# Patient Record
Sex: Female | Born: 1965 | Race: White | Hispanic: No | Marital: Married | Smoking: Never smoker
Health system: Southern US, Community
[De-identification: ages and names within clinical notes are randomized; demographics above are authoritative.]

## PROBLEM LIST (undated history)

## (undated) DIAGNOSIS — E119 Type 2 diabetes mellitus without complications: Secondary | ICD-10-CM

## (undated) HISTORY — PX: COSMETIC SURGERY: SHX468

---

## 1997-06-01 ENCOUNTER — Other Ambulatory Visit: Admission: RE | Admit: 1997-06-01 | Discharge: 1997-06-01 | Payer: Self-pay | Admitting: Gynecology

## 1997-06-28 ENCOUNTER — Other Ambulatory Visit: Admission: RE | Admit: 1997-06-28 | Discharge: 1997-06-28 | Payer: Self-pay | Admitting: Gynecology

## 1997-09-15 ENCOUNTER — Ambulatory Visit (HOSPITAL_BASED_OUTPATIENT_CLINIC_OR_DEPARTMENT_OTHER): Admission: RE | Admit: 1997-09-15 | Discharge: 1997-09-15 | Payer: Self-pay | Admitting: *Deleted

## 1997-12-13 ENCOUNTER — Other Ambulatory Visit: Admission: RE | Admit: 1997-12-13 | Discharge: 1997-12-13 | Payer: Self-pay | Admitting: Gynecology

## 1998-02-08 ENCOUNTER — Other Ambulatory Visit: Admission: RE | Admit: 1998-02-08 | Discharge: 1998-02-08 | Payer: Self-pay | Admitting: Gynecology

## 1998-06-27 ENCOUNTER — Other Ambulatory Visit: Admission: RE | Admit: 1998-06-27 | Discharge: 1998-06-27 | Payer: Self-pay | Admitting: Gynecology

## 1998-07-25 ENCOUNTER — Other Ambulatory Visit: Admission: RE | Admit: 1998-07-25 | Discharge: 1998-07-25 | Payer: Self-pay | Admitting: Gynecology

## 1999-01-12 ENCOUNTER — Other Ambulatory Visit: Admission: RE | Admit: 1999-01-12 | Discharge: 1999-01-12 | Payer: Self-pay | Admitting: Gynecology

## 2000-01-18 ENCOUNTER — Other Ambulatory Visit: Admission: RE | Admit: 2000-01-18 | Discharge: 2000-01-18 | Payer: Self-pay | Admitting: Gynecology

## 2000-05-15 ENCOUNTER — Other Ambulatory Visit: Admission: RE | Admit: 2000-05-15 | Discharge: 2000-05-15 | Payer: Self-pay | Admitting: Obstetrics and Gynecology

## 2000-09-03 ENCOUNTER — Inpatient Hospital Stay (HOSPITAL_COMMUNITY): Admission: AD | Admit: 2000-09-03 | Discharge: 2000-09-03 | Payer: Self-pay | Admitting: Obstetrics and Gynecology

## 2000-11-10 ENCOUNTER — Encounter: Payer: Self-pay | Admitting: Obstetrics and Gynecology

## 2000-11-10 ENCOUNTER — Inpatient Hospital Stay (HOSPITAL_COMMUNITY): Admission: AD | Admit: 2000-11-10 | Discharge: 2000-11-13 | Payer: Self-pay | Admitting: Obstetrics and Gynecology

## 2000-11-14 ENCOUNTER — Encounter: Admission: RE | Admit: 2000-11-14 | Discharge: 2000-12-14 | Payer: Self-pay | Admitting: Obstetrics and Gynecology

## 2001-07-09 ENCOUNTER — Other Ambulatory Visit: Admission: RE | Admit: 2001-07-09 | Discharge: 2001-07-09 | Payer: Self-pay | Admitting: Obstetrics and Gynecology

## 2001-09-03 ENCOUNTER — Ambulatory Visit (HOSPITAL_COMMUNITY): Admission: RE | Admit: 2001-09-03 | Discharge: 2001-09-03 | Payer: Self-pay | Admitting: Obstetrics and Gynecology

## 2001-09-03 ENCOUNTER — Encounter: Payer: Self-pay | Admitting: Obstetrics and Gynecology

## 2001-09-05 ENCOUNTER — Encounter: Admission: RE | Admit: 2001-09-05 | Discharge: 2001-12-04 | Payer: Self-pay | Admitting: Obstetrics and Gynecology

## 2001-11-06 ENCOUNTER — Inpatient Hospital Stay (HOSPITAL_COMMUNITY): Admission: AD | Admit: 2001-11-06 | Discharge: 2001-11-06 | Payer: Self-pay | Admitting: Obstetrics and Gynecology

## 2002-01-02 ENCOUNTER — Encounter (HOSPITAL_COMMUNITY): Admission: RE | Admit: 2002-01-02 | Discharge: 2002-01-02 | Payer: Self-pay | Admitting: Obstetrics and Gynecology

## 2002-01-06 ENCOUNTER — Ambulatory Visit (HOSPITAL_COMMUNITY): Admission: RE | Admit: 2002-01-06 | Discharge: 2002-01-06 | Payer: Self-pay | Admitting: Obstetrics and Gynecology

## 2002-01-06 ENCOUNTER — Encounter: Payer: Self-pay | Admitting: Obstetrics and Gynecology

## 2002-01-09 ENCOUNTER — Inpatient Hospital Stay (HOSPITAL_COMMUNITY): Admission: AD | Admit: 2002-01-09 | Discharge: 2002-01-12 | Payer: Self-pay | Admitting: Obstetrics and Gynecology

## 2003-06-08 ENCOUNTER — Other Ambulatory Visit: Admission: RE | Admit: 2003-06-08 | Discharge: 2003-06-08 | Payer: Self-pay | Admitting: Obstetrics and Gynecology

## 2011-03-04 ENCOUNTER — Ambulatory Visit: Payer: Self-pay | Admitting: Family Medicine

## 2011-03-04 VITALS — BP 123/80 | HR 72 | Temp 98.4°F | Resp 16 | Ht 67.0 in | Wt 253.6 lb

## 2011-03-04 DIAGNOSIS — J029 Acute pharyngitis, unspecified: Secondary | ICD-10-CM

## 2011-03-04 LAB — POCT RAPID STREP A (OFFICE): Rapid Strep A Screen: NEGATIVE

## 2011-03-04 MED ORDER — MAGIC MOUTHWASH W/LIDOCAINE: 5.0000 mL | Freq: Four times a day (QID) | ORAL | Status: DC | PRN

## 2011-03-04 MED ORDER — AMOXICILLIN 875 MG PO TABS: 875.0000 mg | ORAL_TABLET | Freq: Two times a day (BID) | ORAL | Status: AC

## 2011-03-04 NOTE — Patient Instructions (Signed)

## 2011-03-04 NOTE — Progress Notes (Signed)
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  O:  Throat is very red with white exudates on both sides, worse on the right. TM's normal Neck is supple without thyromegaly or adenopathy Chest is clear NAD Results for orders placed in visit on 03/04/11  POCT RAPID STREP A (OFFICE)      Component Value Range   Rapid Strep A Screen Negative  Negative     A:  nonstrep pharyngitis P:  amox x 7 days, magic mouthwash

## 2013-12-15 ENCOUNTER — Ambulatory Visit (INDEPENDENT_AMBULATORY_CARE_PROVIDER_SITE_OTHER): Payer: No Typology Code available for payment source | Admitting: Family Medicine

## 2013-12-15 VITALS — BP 128/76 | HR 89 | Temp 98.4°F | Resp 16 | Ht 67.0 in | Wt 248.0 lb

## 2013-12-15 DIAGNOSIS — S46812A Strain of other muscles, fascia and tendons at shoulder and upper arm level, left arm, initial encounter: Secondary | ICD-10-CM

## 2013-12-15 DIAGNOSIS — M79602 Pain in left arm: Secondary | ICD-10-CM

## 2013-12-15 MED ORDER — TRAMADOL HCL 50 MG PO TABS: 50.0000 mg | ORAL_TABLET | Freq: Three times a day (TID) | ORAL | Status: DC | PRN

## 2013-12-15 MED ORDER — METAXALONE 800 MG PO TABS: 800.0000 mg | ORAL_TABLET | Freq: Three times a day (TID) | ORAL | Status: DC
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
## 2013-12-15 NOTE — Patient Instructions (Addendum)
Ice upper shoulder area for 15 or 20 minutes 3 or 4 times daily  Gentle loosening exercises  Avoid hard lifting or straining and overhead work for the next few days.  If not improving over the next few days please return  Take over-the-counter ibuprofen 800 mg 3 times daily (200 mg 4)  Take the tramadol if needed for more severe pain  Take the muscle relaxant one pill 3 times daily, Skelaxin ( metaxalone)

## 2019-01-14 ENCOUNTER — Other Ambulatory Visit (HOSPITAL_COMMUNITY)
Admission: RE | Admit: 2019-01-14 | Discharge: 2019-01-14 | Disposition: A | Discharge status: Home / Self Care | Payer: 59 | Source: Ambulatory Visit | Attending: Physician Assistant | Admitting: Physician Assistant

## 2019-01-14 ENCOUNTER — Other Ambulatory Visit: Payer: Self-pay | Admitting: Physician Assistant

## 2019-01-14 DIAGNOSIS — Z124 Encounter for screening for malignant neoplasm of cervix: Secondary | ICD-10-CM | POA: Insufficient documentation

## 2019-01-15 LAB — CYTOLOGY - PAP: Diagnosis: NEGATIVE

## 2019-02-16 ENCOUNTER — Other Ambulatory Visit: Payer: Self-pay | Admitting: Nurse Practitioner

## 2019-02-16 DIAGNOSIS — Z1231 Encounter for screening mammogram for malignant neoplasm of breast: Secondary | ICD-10-CM

## 2019-03-26 ENCOUNTER — Ambulatory Visit
Admission: RE | Admit: 2019-03-26 | Discharge: 2019-03-26 | Disposition: A | Discharge status: Home / Self Care | Payer: 59 | Source: Ambulatory Visit | Attending: Nurse Practitioner | Admitting: Nurse Practitioner

## 2019-03-26 ENCOUNTER — Other Ambulatory Visit: Payer: Self-pay

## 2019-03-26 DIAGNOSIS — Z1231 Encounter for screening mammogram for malignant neoplasm of breast: Secondary | ICD-10-CM

## 2019-03-30 ENCOUNTER — Other Ambulatory Visit: Payer: Self-pay | Admitting: Nurse Practitioner

## 2019-03-30 DIAGNOSIS — R928 Other abnormal and inconclusive findings on diagnostic imaging of breast: Secondary | ICD-10-CM

## 2019-04-02 ENCOUNTER — Other Ambulatory Visit: Payer: Self-pay | Admitting: Nurse Practitioner

## 2019-04-02 ENCOUNTER — Other Ambulatory Visit: Payer: Self-pay

## 2019-04-02 ENCOUNTER — Ambulatory Visit
Admission: RE | Admit: 2019-04-02 | Discharge: 2019-04-02 | Disposition: A | Discharge status: Home / Self Care | Payer: 59 | Source: Ambulatory Visit | Attending: Nurse Practitioner | Admitting: Nurse Practitioner

## 2019-04-02 DIAGNOSIS — R928 Other abnormal and inconclusive findings on diagnostic imaging of breast: Secondary | ICD-10-CM

## 2019-04-02 DIAGNOSIS — N6489 Other specified disorders of breast: Secondary | ICD-10-CM

## 2019-04-20 ENCOUNTER — Encounter (HOSPITAL_COMMUNITY): Payer: Self-pay

## 2019-04-20 ENCOUNTER — Other Ambulatory Visit: Payer: Self-pay

## 2019-04-20 ENCOUNTER — Emergency Department (HOSPITAL_COMMUNITY): Payer: 59

## 2019-04-20 ENCOUNTER — Emergency Department (HOSPITAL_COMMUNITY)
Admission: EM | Admit: 2019-04-20 | Discharge: 2019-04-20 | Disposition: A | Discharge status: Home / Self Care | Payer: 59 | Attending: Emergency Medicine | Admitting: Emergency Medicine

## 2019-04-20 DIAGNOSIS — Y998 Other external cause status: Secondary | ICD-10-CM | POA: Insufficient documentation

## 2019-04-20 DIAGNOSIS — W010XXA Fall on same level from slipping, tripping and stumbling without subsequent striking against object, initial encounter: Secondary | ICD-10-CM | POA: Insufficient documentation

## 2019-04-20 DIAGNOSIS — E119 Type 2 diabetes mellitus without complications: Secondary | ICD-10-CM | POA: Insufficient documentation

## 2019-04-20 DIAGNOSIS — S43401A Unspecified sprain of right shoulder joint, initial encounter: Secondary | ICD-10-CM | POA: Diagnosis not present

## 2019-04-20 DIAGNOSIS — Y929 Unspecified place or not applicable: Secondary | ICD-10-CM | POA: Diagnosis not present

## 2019-04-20 DIAGNOSIS — Y9389 Activity, other specified: Secondary | ICD-10-CM | POA: Insufficient documentation

## 2019-04-20 DIAGNOSIS — S4991XA Unspecified injury of right shoulder and upper arm, initial encounter: Secondary | ICD-10-CM | POA: Diagnosis present

## 2019-04-20 HISTORY — DX: Type 2 diabetes mellitus without complications: E11.9

## 2019-04-20 MED ORDER — ONDANSETRON HCL 4 MG/2ML IJ SOLN
4.0000 mg | Freq: Once | INTRAMUSCULAR | Status: AC
  Administered 2019-04-20: 06:00:00 4 mg via INTRAVENOUS
  Filled 2019-04-20: qty 2

## 2019-04-20 MED ORDER — MORPHINE SULFATE (PF) 4 MG/ML IV SOLN
4.0000 mg | Freq: Once | INTRAVENOUS | Status: AC
  Administered 2019-04-20: 4 mg via INTRAVENOUS
  Filled 2019-04-20: qty 1

## 2019-04-20 MED ORDER — HYDROCODONE-ACETAMINOPHEN 5-325 MG PO TABS: 1.0000 | ORAL_TABLET | ORAL | 0 refills | Status: DC | PRN

## 2019-04-20 NOTE — ED Notes (Signed)
ED Provider at bedside. 

## 2019-04-20 NOTE — ED Triage Notes (Signed)
Patient arrived today after a mechanical fall landing on her right arm. Patient reporting pain from her shoulder down to her wrist. Currently wrapped in a sling. Pain primarily in shoulder/ upper arm. No obvious deformity.

## 2019-04-20 NOTE — ED Provider Notes (Signed)
[facility] DEPT Provider Note   CSN: [id number] Arrival date & time: 04/20/19  0120     History Chief Complaint  Patient presents with  . Shoulder Injury    Denise Riggs is a 54 y.o. female.  Patient presents to the emergency department for evaluation of right shoulder injury.  Patient reports that she tripped and fell, landing on her right arm.  She is complaining of pain in the right shoulder.  She tried to go to sleep tonight but the pain worsened and now she is having pain radiating down the right arm.  No head injury.  No neck or back injury.        Past Medical History:  Diagnosis Date  . Diabetes mellitus without complication ([place])     There are no problems to display for this patient.   Past Surgical History:  Procedure Laterality Date  . COSMETIC SURGERY       OB History   No obstetric history on file.     Family History  Problem Relation Age of Onset  . Diabetes Mother   . Heart disease Mother   . Hypertension Mother   . Stroke Mother   . Diabetes Father     Social History   Tobacco Use  . Smoking status: Never Smoker  Substance Use Topics  . Alcohol use: Not on file  . Drug use: Not on file    Home Medications Prior to Admission medications   Medication Sig Start Date End Date Taking? Authorizing Provider  Alum & Mag Hydroxide-Simeth (MAGIC MOUTHWASH W/LIDOCAINE) SOLN Take 5 mLs by mouth 4 (four) times daily as needed. Patient not taking: Reported on 12/15/2013 03/04/11   Robyn Haber, MD  guaiFENesin (MUCINEX) 600 MG 12 hr tablet Take 1,200 mg by mouth 2 (two) times daily.    [provider]  HYDROcodone-acetaminophen (NORCO/VICODIN) 5-325 MG tablet Take 1 tablet by mouth every 4 (four) hours as needed for moderate pain. 04/20/19   Orpah Greek, MD  metaxalone (SKELAXIN) 800 MG tablet Take 1 tablet (800 mg total) by mouth 3 (three) times daily. 12/15/13   Posey Boyer, MD    naproxen sodium (ANAPROX) 220 MG tablet Take 220 mg by mouth 2 (two) times daily with a meal.    [provider]  traMADol (ULTRAM) 50 MG tablet Take 1 tablet (50 mg total) by mouth every 8 (eight) hours as needed. 12/15/13   Posey Boyer, MD    Allergies    Patient has no known allergies.  Review of Systems   Review of Systems  Musculoskeletal: Positive for arthralgias.  Neurological: Negative for syncope.  All other systems reviewed and are negative.   Physical Exam Updated Vital Signs BP 137/80   Pulse 84   Temp 98.7 F (37.1 C)   Resp 18   Ht 5\' 7"  (1.702 m)   Wt 103 kg   SpO2 97%   BMI 35.55 kg/m   Physical Exam Vitals and nursing note reviewed.  Constitutional:      General: She is not in acute distress.    Appearance: Normal appearance. She is well-developed.  HENT:     Head: Normocephalic and atraumatic.     Right Ear: Hearing normal.     Left Ear: Hearing normal.     Nose: Nose normal.  Eyes:     Conjunctiva/sclera: Conjunctivae normal.     Pupils: Pupils are equal, round, and reactive to light.  Cardiovascular:  Rate and Rhythm: Regular rhythm.     Heart sounds: S1 normal and S2 normal. No murmur. No friction rub. No gallop.   Pulmonary:     Effort: Pulmonary effort is normal. No respiratory distress.     Breath sounds: Normal breath sounds.  Chest:     Chest wall: No tenderness.  Abdominal:     General: Bowel sounds are normal.     Palpations: Abdomen is soft.     Tenderness: There is no abdominal tenderness. There is no guarding or rebound. Negative signs include Murphy's sign and McBurney's sign.     Hernia: No hernia is present.  Musculoskeletal:     Right shoulder: Tenderness present. No swelling or deformity. Decreased range of motion.     Right upper arm: No deformity.     Right elbow: No swelling or deformity. Normal range of motion.     Cervical back: Normal range of motion and neck supple.  Skin:    General: Skin is warm  and dry.     Findings: No rash.  Neurological:     Mental Status: She is alert and oriented to person, place, and time.     GCS: GCS eye subscore is 4. GCS verbal subscore is 5. GCS motor subscore is 6.     Cranial Nerves: No cranial nerve deficit.     Sensory: No sensory deficit.     Coordination: Coordination normal.  Psychiatric:        Speech: Speech normal.        Behavior: Behavior normal.        Thought Content: Thought content normal.     ED Results / Procedures / Treatments   Labs (all labs ordered are listed, but only abnormal results are displayed) Labs Reviewed - No data to display  EKG None  Radiology DG Shoulder Right  Result Date: 04/20/2019 CLINICAL DATA:  Initial evaluation for acute trauma, fall. EXAM: RIGHT SHOULDER - 2+ VIEW COMPARISON:  None. FINDINGS: Osseous density seen at the inferior margin of the glenoid, age indeterminate, but could reflect an acute fracture, most commonly seen in the setting of anterior shoulder dislocation. Possible mild Hill-Sachs deformity noted at the superolateral right humeral head, also age indeterminate. AC joint approximated. Peritoneal soft tissues within normal limits. Visualized right lung clear. IMPRESSION: 1. Osseous density at the inferior margin of the glenoid, age indeterminate, but could reflect an acute fracture, most commonly seen in the setting of anterior shoulder dislocation. 2. Possible mild Hill-Sachs deformity at the superolateral right humeral head, also age indeterminate. Correlation with physical exam recommended. Electronically Signed   By: Jeannine Boga M.D.   On: 04/20/2019 02:37    Procedures Procedures (including critical care time)  Medications Ordered in ED Medications  morphine 4 MG/ML injection 4 mg (4 mg Intravenous Given 04/20/19 0536)  ondansetron (ZOFRAN) injection 4 mg (4 mg Intravenous Given 04/20/19 0536)    ED Course  I have reviewed the triage vital signs and the nursing  notes.  Pertinent labs & imaging results that were available during my care of the patient were reviewed by me and considered in my medical decision making (see chart for details).    MDM Rules/Calculators/A&P                      Patient presents with right shoulder injury after a fall.  She denies any previous shoulder injuries.  X-ray shows a small osseous density in the inferior margin of  the glenoid which could be a chip fracture.  She also has a possible Hill-Sachs deformity.  No evidence of dislocation currently.  Will treat with analgesia, shoulder sling and follow-up with orthopedics.  Final Clinical Impression(s) / ED Diagnoses Final diagnoses:  Sprain of right shoulder, unspecified shoulder sprain type, initial encounter    Rx / DC Orders ED Discharge Orders         Ordered    HYDROcodone-acetaminophen (NORCO/VICODIN) 5-325 MG tablet  Every 4 hours PRN     04/20/19 0556           Orpah Greek, MD 04/20/19 [phone]

## 2019-04-20 NOTE — Progress Notes (Signed)
Orthopedic Tech Progress Note Patient Details:  Denise Riggs [birth year] [id number]  Ortho Devices Type of Ortho Device: Shoulder immobilizer       [name] 04/20/2019, 12:42 PM

## 2019-04-28 ENCOUNTER — Other Ambulatory Visit: Payer: Self-pay

## 2019-04-28 ENCOUNTER — Encounter: Payer: Self-pay | Admitting: Orthopaedic Surgery

## 2019-04-28 ENCOUNTER — Ambulatory Visit (INDEPENDENT_AMBULATORY_CARE_PROVIDER_SITE_OTHER): Payer: 59 | Admitting: Orthopaedic Surgery

## 2019-04-28 DIAGNOSIS — M25511 Pain in right shoulder: Secondary | ICD-10-CM | POA: Insufficient documentation

## 2019-04-28 NOTE — Progress Notes (Signed)
Office Visit Note   Patient: Denise Riggs           Date of Birth: 25-Jan-1965           MRN: [id number] Visit Date: 04/28/2019              Requested by: Johna Roles, PA [address] [place],  [place] [address] PCP: Johna Roles, [place]   Assessment & Plan: Visit Diagnoses:  1. Acute pain of right shoulder     Plan: We reviewed the x-rays with her.  We will check her back in a couple weeks she can work on some gentle range of motion and see how she does.  If she has persistent symptoms we can consider diagnostic imaging.  She did have a small Hill-Sachs lesion and also inferior bone to the joint likely off the glenoid or could have been old capsular calcification.  Recheck 2 weeks.  Follow-Up Instructions: Return in about 2 weeks (around 05/12/2019).   Orders:  No orders of the defined types were placed in this encounter.  No orders of the defined types were placed in this encounter.     Procedures: No procedures performed   Clinical Data: No additional findings.   Subjective: Chief Complaint  Patient presents with  . Right Shoulder - Pain    Fall 04/20/2019    HPI 54 year old female tripped and fell landing on her right elbow and shoulder.  She was seen in emergency room after a fall which occurred on 04/20/2019.  X-rays demonstrated bone inferior to the glenoid did not really appear acute also Hill-Sachs deformity.  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  She has had some pain over the medial epicondyle after a fall where she has bruising.  Some pain in her shoulder particularly with abduction but can get her arm up overhead in the flexed position.  Patient is used Naprosyn and then later Aleve which is helped.  She is used a sling intermittently.   Patient was given a prescription of Norco.  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 Review of Systems 14 point review of systems noncontributory.   Objective: Vital Signs: BP 125/77   Pulse 85   Ht 5\' 7"  (1.702 m)   Wt 227 lb (103 kg)   BMI 35.55 kg/m   Physical Exam Constitutional:      Appearance: She is well-developed.  HENT:     Head: Normocephalic.     Right Ear: External ear normal.     Left Ear: External ear normal.  Eyes:     Pupils: Pupils are equal, round, and reactive to light.  Neck:     Thyroid: No thyromegaly.     Trachea: No tracheal deviation.  Cardiovascular:     Rate and Rhythm: Normal rate.  Pulmonary:     Effort: Pulmonary effort is normal.  Abdominal:     Palpations: Abdomen is soft.  Skin:    General: Skin is warm and dry.  Neurological:     Mental Status: She is alert and oriented to person, place, and time.  Psychiatric:        Behavior: Behavior normal.     Ortho Exam patient has ecchymosis over the medial epicondyle  proximal forearm but skin is intact.  She can place her hand the top of her head.  Above that she has tightness and discomfort in her shoulder not really describing acute pain.  No supraclavicular lymphadenopathy.  No tenderness along his biceps.  She has some discomfort with abduction.  Specialty Comments:  No specialty comments available.  Imaging: CLINICAL DATA:  Initial evaluation for acute trauma, fall.  EXAM: RIGHT SHOULDER - 2+ VIEW  COMPARISON:  None.  FINDINGS: Osseous density seen at the inferior margin of the glenoid, age indeterminate, but could reflect an acute fracture, most commonly seen in the setting of anterior shoulder dislocation. Possible mild Hill-Sachs deformity noted at the superolateral right humeral head, also age indeterminate. AC joint approximated. Peritoneal soft tissues within normal limits. Visualized right lung clear.  IMPRESSION: 1. Osseous density at the inferior  margin of the glenoid, age indeterminate, but could reflect an acute fracture, most commonly seen in the setting of anterior shoulder dislocation. 2. Possible mild Hill-Sachs deformity at the superolateral right humeral head, also age indeterminate. Correlation with physical exam recommended.   Electronically Signed   By: Jeannine Boga M.D.   On: 04/20/2019 02:37   PMFS History: Patient Active Problem List   Diagnosis Date Noted  . Pain in right shoulder 04/28/2019   Past Medical History:  Diagnosis Date  . Diabetes mellitus without complication ([place])     Family History  Problem Relation Age of Onset  . Diabetes Mother   . Heart disease Mother   . Hypertension Mother   . Stroke Mother   . Diabetes Father     Past Surgical History:  Procedure Laterality Date  . COSMETIC SURGERY     Social History   Occupational History  . Not on file  Tobacco Use  . Smoking status: Never Smoker  . Smokeless tobacco: Never Used  Substance and Sexual Activity  . Alcohol use: Not on file  . Drug use: Not on file  . Sexual activity: Not on file

## 2019-05-12 ENCOUNTER — Other Ambulatory Visit: Payer: Self-pay

## 2019-05-12 ENCOUNTER — Ambulatory Visit (INDEPENDENT_AMBULATORY_CARE_PROVIDER_SITE_OTHER): Payer: 59 | Admitting: Orthopaedic Surgery

## 2019-05-12 ENCOUNTER — Encounter: Payer: Self-pay | Admitting: Orthopaedic Surgery

## 2019-05-12 VITALS — Ht 67.0 in | Wt 227.0 lb

## 2019-05-12 DIAGNOSIS — M25511 Pain in right shoulder: Secondary | ICD-10-CM | POA: Diagnosis not present

## 2019-05-12 NOTE — Progress Notes (Signed)
Office Visit Note   Patient: Denise Riggs           Date of Birth: 1965-12-01           MRN: [id number] Visit Date: 05/12/2019              Requested by: Johna Roles, PA [address] [place],  [place] [address] PCP: Johna Roles, [place]   Assessment & Plan: Visit Diagnoses:  1. Right shoulder pain, unspecified chronicity     Plan: Patient will continue to work on some gentle range of motion. Her previous x-ray showed possible Hill-Sachs deformity age-indeterminate.  Small piece of bone inferior to the glenoid.  She will continue to work on gentle range of motion I will check her in 5 weeks and we can make a decision about possible MRI imaging at that time.  If she continues to improve then imaging can be deferred. Follow-Up Instructions: Return in about 5 weeks (around 06/16/2019).   Orders:  No orders of the defined types were placed in this encounter.  No orders of the defined types were placed in this encounter.     Procedures: No procedures performed   Clinical Data: No additional findings.   Subjective: Chief Complaint  Patient presents with  . Right Shoulder - Follow-up    Fall 04/20/2019    HPI patient turns post fall 04/20/2019 with persistent problems with her shoulder she describes tightness and some pulling.  She had to reach up and get a basket hyper overhead and felt like her arm was not strong in that position.  She is not had any sharp acute severe pain.  She played softball for many years does not recall an episode shoulder subluxation or dislocation.  She did break her wrist when she was 54 years old but did not injure her shoulder.  Review of Systems 14 point system update unchanged and noncontributory.   Objective: Vital Signs: Ht 5\' 7"  (1.702 m)   Wt 227 lb (103 kg)   BMI 35.55 kg/m   Physical Exam Constitutional:      Appearance: She is well-developed.  HENT:     Head: Normocephalic.     Right Ear: External ear  normal.     Left Ear: External ear normal.  Eyes:     Pupils: Pupils are equal, round, and reactive to light.  Neck:     Thyroid: No thyromegaly.     Trachea: No tracheal deviation.  Cardiovascular:     Rate and Rhythm: Normal rate.  Pulmonary:     Effort: Pulmonary effort is normal.  Abdominal:     Palpations: Abdomen is soft.  Skin:    General: Skin is warm and dry.  Neurological:     Mental Status: She is alert and oriented to person, place, and time.  Psychiatric:        Behavior: Behavior normal.     Ortho Exam patient can internally rotate her right shoulder past axillary line with her hand.  She has trouble with flexion more than 90 degrees the elbow extended but can flex the elbow and then get her arm up overhead.  Some discomfort with external rotation negative drop arm test supraspinatus is strong.  Sensory to the hand is normal.  Mild tenderness long head of the biceps tendon.  Specialty Comments:  No specialty comments available.  Imaging: No results found.   PMFS History: Patient Active Problem List   Diagnosis Date Noted  .  Pain in right shoulder 04/28/2019   Past Medical History:  Diagnosis Date  . Diabetes mellitus without complication ([place])     Family History  Problem Relation Age of Onset  . Diabetes Mother   . Heart disease Mother   . Hypertension Mother   . Stroke Mother   . Diabetes Father     Past Surgical History:  Procedure Laterality Date  . COSMETIC SURGERY     Social History   Occupational History  . Not on file  Tobacco Use  . Smoking status: Never Smoker  . Smokeless tobacco: Never Used  Substance and Sexual Activity  . Alcohol use: Not on file  . Drug use: Not on file  . Sexual activity: Not on file

## 2019-06-16 ENCOUNTER — Ambulatory Visit (INDEPENDENT_AMBULATORY_CARE_PROVIDER_SITE_OTHER): Payer: 59 | Admitting: Orthopaedic Surgery

## 2019-06-16 ENCOUNTER — Encounter: Payer: Self-pay | Admitting: Orthopaedic Surgery

## 2019-06-16 VITALS — BP 114/68 | HR 75 | Ht 67.0 in | Wt 227.0 lb

## 2019-06-16 DIAGNOSIS — M25511 Pain in right shoulder: Secondary | ICD-10-CM | POA: Diagnosis not present

## 2019-06-16 NOTE — Progress Notes (Signed)
Office Visit Note   Patient: Denise Riggs           Date of Birth: 1965-08-30           MRN: [id number] Visit Date: 06/16/2019              Requested by: Johna Roles, PA [address] [place],  [place] [address] PCP: Johna Roles, [place]   Assessment & Plan: Visit Diagnoses:  1. Right shoulder pain, unspecified chronicity     Plan: Patient got improvement with conservative treatment and improved range of motion in her shoulder.  We will check her back again in 3 months.  If she has recurrence of symptoms and starts losing motion with increased pain she can return earlier.  Follow-Up Instructions: Return in about 3 months (around 09/16/2019).   Orders:  No orders of the defined types were placed in this encounter.  No orders of the defined types were placed in this encounter.     Procedures: No procedures performed   Clinical Data: No additional findings.   Subjective: Chief Complaint  Patient presents with  . Right Shoulder - Pain, Follow-up    HPI 54 year old female returns for follow-up of right shoulder.  She states she has been working on range of motion and has had general improvement in her range of motion and can get her arm up overhead.  She is but still feels some tightness when she reaches behind her with internal rotation but is able to fasten her bra off behind her back.  She thinks her symptoms are at least 75% better she has been using Aleve or ibuprofen as needed.  Review of Systems 14 point system update unchanged from 05/12/2019 other than as mentioned in HPI.   Objective: Vital Signs: BP 114/68   Pulse 75   Ht 5\' 7"  (1.702 m)   Wt 227 lb (103 kg)   BMI 35.55 kg/m   Physical Exam Constitutional:      Appearance: She is well-developed.  HENT:     Head: Normocephalic.     Right Ear: External ear normal.     Left Ear: External ear normal.  Eyes:     Pupils: Pupils are equal, round, and reactive to light.  Neck:      Thyroid: No thyromegaly.     Trachea: No tracheal deviation.  Cardiovascular:     Rate and Rhythm: Normal rate.  Pulmonary:     Effort: Pulmonary effort is normal.  Abdominal:     Palpations: Abdomen is soft.  Skin:    General: Skin is warm and dry.  Neurological:     Mental Status: She is alert and oriented to person, place, and time.  Psychiatric:        Behavior: Behavior normal.     Ortho Exam  Specialty Comments:  No specialty comments available.  Imaging: No results found.   PMFS History: Patient Active Problem List   Diagnosis Date Noted  . Pain in right shoulder 04/28/2019   Past Medical History:  Diagnosis Date  . Diabetes mellitus without complication ([place])     Family History  Problem Relation Age of Onset  . Diabetes Mother   . Heart disease Mother   . Hypertension Mother   . Stroke Mother   . Diabetes Father     Past Surgical History:  Procedure Laterality Date  . COSMETIC SURGERY     Social History   Occupational History  . Not  on file  Tobacco Use  . Smoking status: Never Smoker  . Smokeless tobacco: Never Used  Substance and Sexual Activity  . Alcohol use: Not on file  . Drug use: Not on file  . Sexual activity: Not on file

## 2019-08-13 ENCOUNTER — Other Ambulatory Visit: Payer: Self-pay | Admitting: Gastroenterology

## 2019-09-22 ENCOUNTER — Ambulatory Visit (INDEPENDENT_AMBULATORY_CARE_PROVIDER_SITE_OTHER): Payer: 59 | Admitting: Orthopaedic Surgery

## 2019-09-22 ENCOUNTER — Encounter: Payer: Self-pay | Admitting: Orthopaedic Surgery

## 2019-09-22 VITALS — BP 115/72 | Ht 67.0 in | Wt 230.0 lb

## 2019-09-22 DIAGNOSIS — M25511 Pain in right shoulder: Secondary | ICD-10-CM | POA: Diagnosis not present

## 2019-09-22 NOTE — Progress Notes (Signed)
Office Visit Note   Patient: Denise Riggs           Date of Birth: [birth year]           MRN: [id number] Visit Date: 09/22/2019              Requested by: Johna Roles, PA [address] [place],  [place] [address] PCP: Johna Roles, [place]   Assessment & Plan: Visit Diagnoses:  1. Right shoulder pain, unspecified chronicity     Plan: Patient's been through conservative treatment including Tylenol ibuprofen greater than 76months persistent symptoms with the right shoulder with pain.  Plain radiograph showed possible Hill-Sachs deformity after her remote fall age-indeterminate and also small piece of osseous density inferior margin of the glenoid.  She has pain with activities of daily living washing her hair getting dressed doing yard work trimming plants etc.  Would recommend proceeding with MRI scan right shoulder for evaluation of possible tendinopathy versus labral tear with previous subluxation episode.  Office follow-up after MRI.  Follow-Up Instructions: ROV after scan.  Orders:  No orders of the defined types were placed in this encounter.  No orders of the defined types were placed in this encounter.     Procedures: No procedures performed   Clinical Data: No additional findings.   Subjective: Chief Complaint  Patient presents with  . Right Shoulder - Follow-up    Fall 04/20/2019    HPI 54 year old female returns for follow-up post fall 04/20/2019 persistent symptoms in her shoulder.  Initially she got about 75% better with the conservative treatment and exercise program.  She still has problems at night sleeping she still taking medication.  She was hoping that her shoulder would continue to improve but is plateaued with persistent pain with activities of daily living.  She denies numbness or tingling in her arm sometimes it radiates from the anterior shoulder down to the midportion of the biceps.  She has not noticed any instability.  No  associated neck pain no gait disturbance.  Review of Systems all other systems are updated unchanged from 06/16/2019 office visit other than as mentioned HPI.   Objective: Vital Signs: BP 115/72   Ht 5\' 7"  (1.702 m)   Wt 230 lb (104.3 kg)   BMI 36.02 kg/m   Physical Exam Constitutional:      Appearance: She is well-developed.  HENT:     Head: Normocephalic.     Right Ear: External ear normal.     Left Ear: External ear normal.  Eyes:     Pupils: Pupils are equal, round, and reactive to light.  Neck:     Thyroid: No thyromegaly.     Trachea: No tracheal deviation.  Cardiovascular:     Rate and Rhythm: Normal rate.  Pulmonary:     Effort: Pulmonary effort is normal.  Abdominal:     Palpations: Abdomen is soft.  Skin:    General: Skin is warm and dry.  Neurological:     Mental Status: She is alert and oriented to person, place, and time.  Psychiatric:        Behavior: Behavior normal.     Ortho Exam patient has 2+ upper extremity reflexes.  She has some limitation of external rotation by 30 degrees right shoulder dominant in comparison to opposite left shoulder which actually rotated 90 degrees.  Some pain with resisted supraspinatus testing no supraspinatus atrophy.  Mild brachial plexus tenderness good cervical flexion extension normal heel  toe gait no lower extremity clonus.  Pain with resisted supraspinatus testing.  Subscap is strong.  Specialty Comments:  No specialty comments available.  Imaging: CLINICAL DATA:  Initial evaluation for acute trauma, fall.  EXAM: RIGHT SHOULDER - 2+ VIEW  COMPARISON:  None.  FINDINGS: Osseous density seen at the inferior margin of the glenoid, age indeterminate, but could reflect an acute fracture, most commonly seen in the setting of anterior shoulder dislocation. Possible mild Hill-Sachs deformity noted at the superolateral right humeral head, also age indeterminate. AC joint approximated. Peritoneal soft tissues within  normal limits. Visualized right lung clear.  IMPRESSION: 1. Osseous density at the inferior margin of the glenoid, age indeterminate, but could reflect an acute fracture, most commonly seen in the setting of anterior shoulder dislocation. 2. Possible mild Hill-Sachs deformity at the superolateral right humeral head, also age indeterminate. Correlation with physical exam recommended.   Electronically Signed   By: Jeannine Boga M.D.   On: 04/20/2019 02:37   PMFS History: Patient Active Problem List   Diagnosis Date Noted  . Pain in right shoulder 04/28/2019   Past Medical History:  Diagnosis Date  . Diabetes mellitus without complication ([place])     Family History  Problem Relation Age of Onset  . Diabetes Mother   . Heart disease Mother   . Hypertension Mother   . Stroke Mother   . Diabetes Father     Past Surgical History:  Procedure Laterality Date  . COSMETIC SURGERY     Social History   Occupational History  . Not on file  Tobacco Use  . Smoking status: Never Smoker  . Smokeless tobacco: Never Used  Substance and Sexual Activity  . Alcohol use: Not on file  . Drug use: Not on file  . Sexual activity: Not on file

## 2019-09-22 NOTE — Addendum Note (Signed)
Addended by: Meyer Cory on: 09/22/2019 09:23 AM   Modules accepted: Orders

## 2019-10-05 ENCOUNTER — Ambulatory Visit
Admission: RE | Admit: 2019-10-05 | Discharge: 2019-10-05 | Disposition: A | Discharge status: Home / Self Care | Payer: 59 | Source: Ambulatory Visit | Attending: Nurse Practitioner | Admitting: Nurse Practitioner

## 2019-10-05 ENCOUNTER — Other Ambulatory Visit: Payer: Self-pay | Admitting: Gastroenterology

## 2019-10-05 ENCOUNTER — Other Ambulatory Visit: Payer: Self-pay

## 2019-10-05 ENCOUNTER — Other Ambulatory Visit: Payer: Self-pay | Admitting: Nurse Practitioner

## 2019-10-05 DIAGNOSIS — N6489 Other specified disorders of breast: Secondary | ICD-10-CM

## 2019-10-09 ENCOUNTER — Other Ambulatory Visit (HOSPITAL_COMMUNITY)
Admission: RE | Admit: 2019-10-09 | Discharge: 2019-10-09 | Disposition: A | Discharge status: Home / Self Care | Payer: 59 | Source: Ambulatory Visit | Attending: Gastroenterology | Admitting: Gastroenterology

## 2019-10-09 DIAGNOSIS — Z20822 Contact with and (suspected) exposure to covid-19: Secondary | ICD-10-CM | POA: Diagnosis not present

## 2019-10-09 DIAGNOSIS — Z01812 Encounter for preprocedural laboratory examination: Secondary | ICD-10-CM | POA: Diagnosis present

## 2019-10-09 LAB — SARS CORONAVIRUS 2 (TAT 6-24 HRS): SARS Coronavirus 2: NEGATIVE

## 2019-10-12 NOTE — Anesthesia Preprocedure Evaluation (Addendum)
Anesthesia Evaluation  Patient identified by MRN, date of birth, ID band Patient awake    Reviewed: Allergy & Precautions, NPO status , Patient's Chart, lab work & pertinent test results  Airway Mallampati: II  TM Distance: >3 FB Neck ROM: Full    Dental no notable dental hx. (+) Teeth Intact, Dental Advisory Given   Pulmonary neg pulmonary ROS,    Pulmonary exam normal breath sounds clear to auscultation       Cardiovascular negative cardio ROS Normal cardiovascular exam Rhythm:Regular Rate:Normal     Neuro/Psych negative neurological ROS  negative psych ROS   GI/Hepatic Neg liver ROS, Colon polyp   Endo/Other  diabetes, Oral Hypoglycemic AgentsBMI 36  Renal/GU negative Renal ROS  negative genitourinary   Musculoskeletal negative musculoskeletal ROS (+)   Abdominal   Peds negative pediatric ROS (+)  Hematology negative hematology ROS (+)   Anesthesia Other Findings   Reproductive/Obstetrics negative OB ROS                            Anesthesia Physical Anesthesia Plan  ASA: II  Anesthesia Plan: MAC   Post-op Pain Management:    Induction:   PONV Risk Score and Plan: 2 and Propofol infusion and TIVA  Airway Management Planned: Natural Airway  Additional Equipment: None  Intra-op Plan:   Post-operative Plan:   Informed Consent: I have reviewed the patients History and Physical, chart, labs and discussed the procedure including the risks, benefits and alternatives for the proposed anesthesia with the patient or authorized representative who has indicated his/her understanding and acceptance.       Plan Discussed with: CRNA  Anesthesia Plan Comments:        Anesthesia Quick Evaluation

## 2019-10-13 ENCOUNTER — Encounter (HOSPITAL_COMMUNITY): Payer: Self-pay | Admitting: Gastroenterology

## 2019-10-13 ENCOUNTER — Encounter (HOSPITAL_COMMUNITY)
Admission: RE | Disposition: A | Discharge status: Home / Self Care | Payer: Self-pay | Source: Home / Self Care | Attending: Gastroenterology

## 2019-10-13 ENCOUNTER — Ambulatory Visit (HOSPITAL_COMMUNITY): Payer: 59 | Admitting: Anesthesiology

## 2019-10-13 ENCOUNTER — Other Ambulatory Visit: Payer: Self-pay

## 2019-10-13 ENCOUNTER — Ambulatory Visit (HOSPITAL_COMMUNITY)
Admission: RE | Admit: 2019-10-13 | Discharge: 2019-10-13 | Disposition: A | Discharge status: Home / Self Care | Payer: 59 | Attending: Gastroenterology | Admitting: Gastroenterology

## 2019-10-13 DIAGNOSIS — Z9889 Other specified postprocedural states: Secondary | ICD-10-CM | POA: Diagnosis not present

## 2019-10-13 DIAGNOSIS — Z09 Encounter for follow-up examination after completed treatment for conditions other than malignant neoplasm: Secondary | ICD-10-CM | POA: Diagnosis present

## 2019-10-13 DIAGNOSIS — Z8616 Personal history of COVID-19: Secondary | ICD-10-CM | POA: Diagnosis not present

## 2019-10-13 DIAGNOSIS — E119 Type 2 diabetes mellitus without complications: Secondary | ICD-10-CM | POA: Insufficient documentation

## 2019-10-13 DIAGNOSIS — Z86010 Personal history of colonic polyps: Secondary | ICD-10-CM | POA: Diagnosis not present

## 2019-10-13 DIAGNOSIS — Z79899 Other long term (current) drug therapy: Secondary | ICD-10-CM | POA: Insufficient documentation

## 2019-10-13 DIAGNOSIS — K640 First degree hemorrhoids: Secondary | ICD-10-CM | POA: Diagnosis not present

## 2019-10-13 DIAGNOSIS — Z7984 Long term (current) use of oral hypoglycemic drugs: Secondary | ICD-10-CM | POA: Insufficient documentation

## 2019-10-13 DIAGNOSIS — D369 Benign neoplasm, unspecified site: Secondary | ICD-10-CM | POA: Diagnosis present

## 2019-10-13 HISTORY — PX: FLEXIBLE SIGMOIDOSCOPY: SHX5431

## 2019-10-13 LAB — GLUCOSE, CAPILLARY: Glucose-Capillary: 99 mg/dL (ref 70–99)

## 2019-10-13 SURGERY — SIGMOIDOSCOPY, FLEXIBLE
Anesthesia: Monitor Anesthesia Care

## 2019-10-13 MED ORDER — PROPOFOL 10 MG/ML IV BOLUS
INTRAVENOUS | Status: DC | PRN
  Administered 2019-10-13: 20 mg via INTRAVENOUS

## 2019-10-13 MED ORDER — SODIUM CHLORIDE 0.9 % IV SOLN: INTRAVENOUS | Status: DC

## 2019-10-13 MED ORDER — LACTATED RINGERS IV SOLN: INTRAVENOUS | Status: DC

## 2019-10-13 MED ORDER — PROPOFOL 500 MG/50ML IV EMUL
INTRAVENOUS | Status: DC | PRN
  Administered 2019-10-13: 150 ug/kg/min via INTRAVENOUS

## 2019-10-13 MED ORDER — ONDANSETRON HCL 4 MG/2ML IJ SOLN
INTRAMUSCULAR | Status: DC | PRN
  Administered 2019-10-13: 4 mg via INTRAVENOUS

## 2019-10-13 MED ORDER — PROPOFOL 1000 MG/100ML IV EMUL
INTRAVENOUS | Status: AC
  Filled 2019-10-13: qty 100

## 2019-10-13 NOTE — Op Note (Signed)
[facility] Patient Name: Denise Riggs Procedure Date: 10/13/2019 MRN: [id number] Attending MD: Lear Ng , MD Date of Birth: 05/28/1965 CSN: [id number] Age: 54 Admit Type: Outpatient Procedure:                Flexible Sigmoidoscopy Indications:              Adenomatous polyps in the rectum, Follow-up of                            adenomatous polyps in the rectum Providers:                Lear Ng, MD, Grace Isaac, RN, Mikey College, RN, William Dalton, Technician Referring MD:              Medicines:                Propofol per Anesthesia, Monitored Anesthesia Care Complications:            No immediate complications. Estimated Blood Loss:     Estimated blood loss: none. Procedure:                Pre-Anesthesia Assessment:                           - Prior to the procedure, a History and Physical                            was performed, and patient medications and                            allergies were reviewed. The patient's tolerance of                            previous anesthesia was also reviewed. The risks                            and benefits of the procedure and the sedation                            options and risks were discussed with the patient.                            All questions were answered, and informed consent                            was obtained. Prior Anticoagulants: The patient has                            taken no previous anticoagulant or antiplatelet                            agents. ASA Grade Assessment: II - A patient with  mild systemic disease. After reviewing the risks                            and benefits, the patient was deemed in                            satisfactory condition to undergo the procedure.                           After obtaining informed consent, the scope was                            passed under direct vision. The  PCF-H190DL                            ([id number]) Olympus pediatric colonscope was                            introduced through the anus and advanced to the the                            left transverse colon. The flexible sigmoidoscopy                            was accomplished without difficulty. The patient                            tolerated the procedure well. The quality of the                            bowel preparation was adequate and good. Scope In: 8:38:01 AM Scope Out: 8:42:43 AM Total Procedure Duration: 0 hours 4 minutes 42 seconds  Findings:      The perianal and digital rectal examinations were normal.      Internal hemorrhoids were found during retroflexion. The hemorrhoids       were small and Grade I (internal hemorrhoids that do not prolapse).      A medium post polypectomy scar was found in the rectum. The scar tissue       was healthy in appearance. No residual polyp tissue was seen. Impression:               - Internal hemorrhoids.                           - Post-polypectomy scar in the rectum.                           - No specimens collected. Moderate Sedation:      Not Applicable - Patient had care per Anesthesia. Recommendation:           - Resume previous diet.                           - Repeat colonoscopy in March 2023. Procedure Code(s):        --- Professional ---  52778, Sigmoidoscopy, flexible; diagnostic,                            including collection of specimen(s) by brushing or                            washing, when performed (separate procedure) Diagnosis Code(s):        --- Professional ---                           D12.8, Benign neoplasm of rectum                           Z98.890, Other specified postprocedural states                           K64.0, First degree hemorrhoids CPT copyright 2019 American Medical Association. All rights reserved. The codes documented in this report are preliminary and upon coder  review may  be revised to meet current compliance requirements. Lear Ng, MD 10/13/2019 8:53:55 AM This report has been signed electronically. Number of Addenda: 0

## 2019-10-13 NOTE — Discharge Instructions (Signed)

## 2019-10-13 NOTE — H&P (Signed)
Date of Initial H&P: 10/05/19  History reviewed, patient examined, no change in status, stable for surgery.

## 2019-10-13 NOTE — Interval H&P Note (Signed)
History and Physical Interval Note:  10/13/2019 8:28 AM  Denise Riggs  has presented today for surgery, with the diagnosis of Colon polyp.  The various methods of treatment have been discussed with the patient and family. After consideration of risks, benefits and other options for treatment, the patient has consented to  Procedure(s): FLEXIBLE SIGMOIDOSCOPY (N/A) HOT HEMOSTASIS (ARGON PLASMA COAGULATION/BICAP) (N/A) as a surgical intervention.  The patient's history has been reviewed, patient examined, no change in status, stable for surgery.  I have reviewed the patient's chart and labs.  Questions were answered to the patient's satisfaction.     Lear Ng

## 2019-10-13 NOTE — Transfer of Care (Signed)
Immediate Anesthesia Transfer of Care Note  Patient: Denise Riggs  Procedure(s) Performed: FLEXIBLE SIGMOIDOSCOPY (N/A )  Patient Location: PACU  Anesthesia Type:MAC  Level of Consciousness: awake, alert  and oriented  Airway & Oxygen Therapy: Patient Spontanous Breathing and Patient connected to face mask oxygen  Post-op Assessment: Report given to RN and Post -op Vital signs reviewed and stable  Post vital signs: Reviewed and stable  Last Vitals:  Vitals Value Taken Time  BP    Temp    Pulse    Resp    SpO2      Last Pain:  Vitals:   10/13/19 0742  TempSrc: Oral  PainSc: 0-No pain         Complications: No complications documented.

## 2019-10-13 NOTE — Anesthesia Postprocedure Evaluation (Signed)
Anesthesia Post Note  Patient: Denise Riggs  Procedure(s) Performed: FLEXIBLE SIGMOIDOSCOPY (N/A )     Patient location during evaluation: PACU Anesthesia Type: MAC Level of consciousness: awake and alert Pain management: pain level controlled Vital Signs Assessment: post-procedure vital signs reviewed and stable Respiratory status: spontaneous breathing, nonlabored ventilation and respiratory function stable Cardiovascular status: blood pressure returned to baseline and stable Postop Assessment: no apparent nausea or vomiting Anesthetic complications: no   No complications documented.  Last Vitals:  Vitals:   10/13/19 0900 10/13/19 0905  BP: 124/74 (!) 116/58  Pulse: 71 77  Resp: 19 19  Temp:    SpO2: 98% 98%    Last Pain:  Vitals:   10/13/19 0905  TempSrc:   PainSc: 0-No pain                 [name]

## 2019-10-13 NOTE — Anesthesia Procedure Notes (Signed)
Procedure Name: MAC Date/Time: 10/13/2019 8:28 AM Performed by: Maxwell Caul, CRNA Pre-anesthesia Checklist: Patient identified, Emergency Drugs available, Suction available and Patient being monitored Oxygen Delivery Method: Simple face mask

## 2019-10-14 ENCOUNTER — Encounter (HOSPITAL_COMMUNITY): Payer: Self-pay | Admitting: Gastroenterology

## 2019-10-15 ENCOUNTER — Ambulatory Visit
Admission: RE | Admit: 2019-10-15 | Discharge: 2019-10-15 | Disposition: A | Discharge status: Home / Self Care | Payer: 59 | Source: Ambulatory Visit | Attending: Orthopaedic Surgery | Admitting: Orthopaedic Surgery

## 2019-10-15 ENCOUNTER — Other Ambulatory Visit: Payer: Self-pay

## 2019-10-15 DIAGNOSIS — M25511 Pain in right shoulder: Secondary | ICD-10-CM

## 2019-10-20 ENCOUNTER — Ambulatory Visit (INDEPENDENT_AMBULATORY_CARE_PROVIDER_SITE_OTHER): Payer: 59 | Admitting: Orthopaedic Surgery

## 2019-10-20 ENCOUNTER — Encounter: Payer: Self-pay | Admitting: Orthopaedic Surgery

## 2019-10-20 VITALS — BP 120/74 | HR 79 | Ht 67.0 in | Wt 227.0 lb

## 2019-10-20 DIAGNOSIS — M7581 Other shoulder lesions, right shoulder: Secondary | ICD-10-CM

## 2019-10-20 DIAGNOSIS — M25511 Pain in right shoulder: Secondary | ICD-10-CM

## 2019-10-20 DIAGNOSIS — S43011S Anterior subluxation of right humerus, sequela: Secondary | ICD-10-CM

## 2019-10-20 NOTE — Progress Notes (Signed)
Office Visit Note   Patient: Denise Riggs           Date of Birth: [birth year]           MRN: [id number] Visit Date: 10/20/2019              Requested by: Johna Roles, PA [address] [place],  [place] [address] PCP: Johna Roles, [place]   Assessment & Plan: Visit Diagnoses:  1. Right shoulder pain, unspecified chronicity   2. Rotator cuff tendinitis, right   3. Anterior subluxation of right shoulder, sequela     Plan: We discussed her injury to her shoulder.  She might require operative intervention for the supraspinatus significant interstitial tearing not full-thickness at this point.  We will set her up for some physical therapy for strengthening subscap.  I plan to recheck her in 6 weeks.  Follow-Up Instructions: Return in about 6 weeks (around 12/01/2019).   Orders:  Orders Placed This Encounter  Procedures  . Ambulatory referral to Physical Therapy   No orders of the defined types were placed in this encounter.     Procedures: No procedures performed   Clinical Data: No additional findings.   Subjective: Chief Complaint  Patient presents with  . Right Shoulder - Follow-up    MRI Right shoulder review    HPI 53 year old female returns with greater than 68-month history of persistent right shoulder pain.  MRI scan has been obtained and is available for review.  Plain radiograph showed possible Hill-Sachs deformity. Date of fall was 04/20/2019.  MRI scans shows moderate supraspinatus infraspinatus tendinosis high-grade partial-thickness articular surface tear and bony Bankart and anterior-inferior glenoid labral detachment with small Hill-Sachs deformity.  The shoulder is appropriately located.  She did have some subacromial and subdeltoid bursitis. Review of Systems 14 point systems updated unchanged from April office visit other than as mentioned above.   Objective: Vital Signs: BP 120/74   Pulse 79   Ht 5\' 7"  (1.702 m)   Wt 227  lb (103 kg)   LMP 09/24/2019   BMI 35.55 kg/m   Physical Exam Constitutional:      Appearance: She is well-developed.  HENT:     Head: Normocephalic.     Right Ear: External ear normal.     Left Ear: External ear normal.  Eyes:     Pupils: Pupils are equal, round, and reactive to light.  Neck:     Thyroid: No thyromegaly.     Trachea: No tracheal deviation.  Cardiovascular:     Rate and Rhythm: Normal rate.  Pulmonary:     Effort: Pulmonary effort is normal.  Abdominal:     Palpations: Abdomen is soft.  Skin:    General: Skin is warm and dry.  Neurological:     Mental Status: She is alert and oriented to person, place, and time.  Psychiatric:        Behavior: Behavior normal.     Ortho Exam upper extremity reflexes are 2+ and symmetrical some limitation in external rotation right shoulder 40 degrees with some discomfort.  Positive impingement pain with resisted supraspinatus testing.  Specialty Comments:  No specialty comments available.  Imaging: CLINICAL DATA:  Right shoulder pain since fall in April.  EXAM: MRI OF THE RIGHT SHOULDER WITHOUT CONTRAST  TECHNIQUE: Multiplanar, multisequence MR imaging of the shoulder was performed. No intravenous contrast was administered.  COMPARISON:  Right shoulder x-rays dated April 20, 2019.  FINDINGS: Rotator cuff:  Moderate supraspinatus tendinosis with high-grade partial-thickness articular surface tear. Moderate infraspinatus tendinosis with high-grade partial-thickness articular surface tear. The teres minor and subscapularis tendons are intact.  Muscles: No atrophy or abnormal signal of the muscles of the rotator cuff.  Biceps long head:  Intact and normally positioned.  Acromioclavicular Joint: Minimal arthropathy of the acromioclavicular joint. Type I acromion. Small amount of fluid in the subacromial/subdeltoid bursa. Moderate amount of fluid in the subcoracoid bursa.  Glenohumeral Joint: No joint  effusion. Partial-thickness cartilage loss over the anterior glenoid.  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 Remaining labrum is grossly intact.  Bones: Chronic bony Bankart injury of the anterior inferior glenoid. Chronic small Hill-Sachs deformity of the posterosuperior humeral head. No acute fracture or dislocation. No suspicious bone lesion.  Other: None.  IMPRESSION: 1. Moderate supraspinatus and infraspinatus tendinosis with high-grade partial-thickness articular surface tears. 2. Sequelae of prior anterior shoulder dislocation with chronic bony Bankart injury of the anterior inferior glenoid/labrum and small Hill-Sachs deformity of the humeral head. 3. Subacromial/subdeltoid and subcoracoid bursitis.   Electronically Signed   By: Titus Dubin M.D.   On: 10/15/2019 09:46   PMFS History: Patient Active Problem List   Diagnosis Date Noted  . Rotator cuff tendinitis, right 10/29/2019  . Anterior subluxation of right shoulder 10/29/2019  . Adenomatous polyps 10/13/2019  . Pain in right shoulder 04/28/2019   Past Medical History:  Diagnosis Date  . Diabetes mellitus without complication ([place])     Family History  Problem Relation Age of Onset  . Diabetes Mother   . Heart disease Mother   . Hypertension Mother   . Stroke Mother   . Diabetes Father     Past Surgical History:  Procedure Laterality Date  . COSMETIC SURGERY    . FLEXIBLE SIGMOIDOSCOPY N/A 10/13/2019   Procedure: FLEXIBLE SIGMOIDOSCOPY;  Surgeon: Wilford Corner, MD;  Location: [facility];  Service: Endoscopy;  Laterality: N/A;   Social History   Occupational History  . Not on file  Tobacco Use  . Smoking status: Never Smoker  . Smokeless tobacco: Never Used  Vaping Use  . Vaping Use: Never used  Substance and Sexual Activity  . Alcohol use: Not on file  . Drug use: Not on file  . Sexual activity: Not on file

## 2019-10-29 DIAGNOSIS — M7581 Other shoulder lesions, right shoulder: Secondary | ICD-10-CM | POA: Insufficient documentation

## 2019-10-29 DIAGNOSIS — S43011A Anterior subluxation of right humerus, initial encounter: Secondary | ICD-10-CM | POA: Insufficient documentation

## 2019-11-04 ENCOUNTER — Other Ambulatory Visit: Payer: Self-pay

## 2019-11-04 ENCOUNTER — Ambulatory Visit (INDEPENDENT_AMBULATORY_CARE_PROVIDER_SITE_OTHER): Payer: 59 | Admitting: Physical Therapy

## 2019-11-04 DIAGNOSIS — M6281 Muscle weakness (generalized): Secondary | ICD-10-CM | POA: Diagnosis not present

## 2019-11-04 DIAGNOSIS — M25611 Stiffness of right shoulder, not elsewhere classified: Secondary | ICD-10-CM | POA: Diagnosis not present

## 2019-11-04 DIAGNOSIS — M25511 Pain in right shoulder: Secondary | ICD-10-CM

## 2019-11-04 NOTE — Therapy (Signed)
[facility] Physical Therapy [address] [place], [place], [address] Phone: [phone]   Fax:  [phone]  Physical Therapy Evaluation  Patient Details  Name: Denise Riggs MRN: [id number] Date of Birth: August 19, 1965 Referring Provider (PT): Rodell Perna, MD   Encounter Date: 11/04/2019   PT End of Session - 11/04/19 0948    Visit Number 1    Number of Visits 13    Date for PT Re-Evaluation 12/23/19    PT Start Time 0805    PT Stop Time 0846    PT Time Calculation (min) 41 min    Activity Tolerance Patient tolerated treatment well    Behavior During Therapy [facility] for tasks assessed/performed           Past Medical History:  Diagnosis Date  . Diabetes mellitus without complication [facility])     Past Surgical History:  Procedure Laterality Date  . COSMETIC SURGERY    . FLEXIBLE SIGMOIDOSCOPY N/A 10/13/2019   Procedure: FLEXIBLE SIGMOIDOSCOPY;  Surgeon: Wilford Corner, MD;  Location: [facility];  Service: Endoscopy;  Laterality: N/A;    There were no vitals filed for this visit.    Subjective Assessment - 11/04/19 0809    Subjective Pt arriving with R shoulder subluxation and partial tear of supraspinatus. Pt reporting difficulty with holding objects and lifting. Pt reporting this began in April 2021. Pt having difficulty sleeping.    Pertinent History DM    Limitations House hold activities;Lifting    Diagnostic tests X-ray    Patient Stated Goals "be able to get back to normal"    Currently in Pain? Yes    Pain Score 3     Pain Location Shoulder    Pain Orientation Right    Pain Descriptors / Indicators Aching;Sore    Pain Type Acute pain    Pain Onset More than a month ago    Pain Frequency Intermittent    Aggravating Factors  raising arm up and when lowering, lifting objects, sleeping    Pain Relieving Factors changing positions              [facility] PT Assessment - 11/04/19 0001      Assessment   Medical Diagnosis M25.511, R  shoulder     Referring Provider (PT) Rodell Perna, MD    Onset Date/Surgical Date 04/20/19    Hand Dominance Right    Prior Therapy no      Precautions   Precautions None      Restrictions   Weight Bearing Restrictions No      Balance Screen   Has the patient fallen in the past 6 months Yes    How many times? 1    Is the patient reluctant to leave their home because of a fear of falling?  No      Prior Function   Level of Independence Independent    Vocation Part time employment    Vocation Requirements working on farm, works part time at [employer], hanging out with my kids      Cognition   Overall Cognitive Status Within Functional Limits for tasks assessed      Observation/Other Assessments   Focus on Therapeutic Outcomes (FOTO)  66 % limitation      Posture/Postural Control   Posture/Postural Control Postural limitations    Postural Limitations Rounded Shoulders;Forward head      ROM / Strength   AROM / PROM / Strength AROM;Strength      AROM  Overall AROM  Deficits    AROM Assessment Site Shoulder    Right/Left Shoulder Right;Left    Right Shoulder Extension 40 Degrees    Right Shoulder Flexion 130 Degrees    Right Shoulder Internal Rotation --   thumb to T12   Right Shoulder External Rotation 30 Degrees    Left Shoulder Extension 50 Degrees    Left Shoulder Flexion 165 Degrees    Left Shoulder Internal Rotation --   thumb to T6   Left Shoulder External Rotation 60 Degrees      Strength   Overall Strength Comments R grip: 54 ppsi, L grip: 56.5ppsi    Strength Assessment Site Shoulder    Right/Left Shoulder Right;Left    Right Shoulder Flexion 3+/5    Right Shoulder Internal Rotation 3+/5    Right Shoulder External Rotation 4-/5    Right Shoulder Horizontal ABduction 3+/5    Left Shoulder Flexion 4+/5    Left Shoulder Extension 4/5    Left Shoulder Internal Rotation 4+/5    Left Shoulder External Rotation 4+/5      Palpation    Palpation comment TTP: R pectoralis, anterior lateral GH joint line,  R upper tap and R supraspinatus      Ambulation/Gait   Gait Pattern Within Functional Limits    Gait Comments mild decrease arm swing on the R compared to left                      Objective measurements completed on examination: See above findings.                    PT Long Term Goals - 11/04/19 0956      PT LONG TERM GOAL #1   Title Pt will be able to perform HEP indepdently and progress appropriately.    Time 6    Period Weeks    Status New    Target Date 12/18/19      PT LONG TERM GOAL #2   Title Pt will be able to lift and lower 10 pound object from counter to upper shelf with pain </=2/10.    Time 6    Period Weeks    Status New    Target Date 12/18/19      PT LONG TERM GOAL #3   Title Pt will improve her FOTO score to </= 35% limitation.    Time 6    Period Weeks    Status New    Target Date 12/18/19      PT LONG TERM GOAL #4   Title pt will be able to improve R shoulder mobility to fasten her bra.    Time 6    Period Weeks    Status New    Target Date 12/18/19      PT LONG TERM GOAL #5   Title Pt will be able to improve R shoulder strength to >/= 4+/5 to improve functional mobiltiy.    Time 6    Period Weeks    Status New    Target Date 12/18/19                  Plan - 11/04/19 0950    Clinical Impression Statement Pt presenting with ongoing R shoulder pain since fall in [place] 2021. Pt dx with partial tear of supraspinatus. Pt presenting with increasing shoulder pain which pt reports is a little better since her injury but not where she wants to be.  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 Pt with pin point tenderness noted over pectoralis insertion anterior/lateral GH joint line and right upper trap with active trigger points noted. Pt with limited ROM and strength compared to her left shoulder. Skilled PT needed to address pt's  impairments with the below interventions.    Personal Factors and Comorbidities Comorbidity 1    Comorbidities DM    Stability/Clinical Decision Making Stable/Uncomplicated    Clinical Decision Making Low    Rehab Potential Good    PT Frequency 2x / week    PT Duration 6 weeks    PT Treatment/Interventions ADLs/Self Care Home Management;Cryotherapy;Electrical Stimulation;Iontophoresis 4mg /ml Dexamethasone;Moist Heat;Ultrasound;Functional mobility training;Therapeutic activities;Therapeutic exercise;Neuromuscular re-education;Patient/family education;Manual techniques;Passive range of motion;Dry needling;Taping    PT Next Visit Plan Add isometrics to HEP, shoulder ROM, shoulder strengthening, modalities as needed.    PT Home Exercise Plan [email or web address]    Consulted and Agree with Plan of Care Patient           Patient will benefit from skilled therapeutic intervention in order to improve the following deficits and impairments:     Visit Diagnosis: Acute pain of right shoulder  Stiffness of right shoulder, not elsewhere classified  Muscle weakness (generalized)     Problem List Patient Active Problem List   Diagnosis Date Noted  . Rotator cuff tendinitis, right 10/29/2019  . Anterior subluxation of right shoulder 10/29/2019  . Adenomatous polyps 10/13/2019  . Pain in right shoulder 04/28/2019    Oretha Caprice, PT, MPT 11/04/2019, 10:02 AM  [facility] Physical Therapy [address] [place], [place], [address] Phone: [phone]   Fax:  [phone]  Name: Denise Riggs MRN: [id number] Date of Birth: Jun 17, 1965

## 2019-11-04 NOTE — Patient Instructions (Signed)
Access Code: [email or web address] URL: [email or web address] Date: 11/04/2019 Prepared by: Kearney Hard  Exercises Supine Shoulder Flexion Extension AAROM with Dowel - 2-3 x daily - 7 x weekly - 2 sets - 10 reps Standing Shoulder Row with Anchored Resistance - 2-3 x daily - 7 x weekly - 2 sets - 10 reps Supine Shoulder External Rotation in 45 Degrees Abduction AAROM with Dowel - 2-3 x daily - 7 x weekly - 2 sets - 10 reps

## 2019-11-12 ENCOUNTER — Ambulatory Visit (INDEPENDENT_AMBULATORY_CARE_PROVIDER_SITE_OTHER): Payer: 59 | Admitting: Rehabilitative and Restorative Service Providers"

## 2019-11-12 ENCOUNTER — Other Ambulatory Visit: Payer: Self-pay

## 2019-11-12 ENCOUNTER — Encounter: Payer: Self-pay | Admitting: Rehabilitative and Restorative Service Providers"

## 2019-11-12 DIAGNOSIS — M6281 Muscle weakness (generalized): Secondary | ICD-10-CM

## 2019-11-12 DIAGNOSIS — M25511 Pain in right shoulder: Secondary | ICD-10-CM | POA: Diagnosis not present

## 2019-11-12 DIAGNOSIS — M25611 Stiffness of right shoulder, not elsewhere classified: Secondary | ICD-10-CM

## 2019-11-12 DIAGNOSIS — G8929 Other chronic pain: Secondary | ICD-10-CM

## 2019-11-12 NOTE — Therapy (Signed)
[facility] Physical Therapy [address] [place], [place], [address] Phone: [phone]   Fax:  [phone]  Physical Therapy Treatment  Patient Details  Name: Denise Riggs MRN: [id number] Date of Birth: 07/05/1965 Referring Provider (PT): Rodell Perna, MD   Encounter Date: 11/12/2019   PT End of Session - 11/12/19 1456    Visit Number 2    Number of Visits 13    Date for PT Re-Evaluation 12/23/19    PT Start Time 0930    PT Stop Time 1012    PT Time Calculation (min) 42 min    Activity Tolerance Patient tolerated treatment well    Behavior During Therapy [facility] for tasks assessed/performed           Past Medical History:  Diagnosis Date  . Diabetes mellitus without complication [facility])     Past Surgical History:  Procedure Laterality Date  . COSMETIC SURGERY    . FLEXIBLE SIGMOIDOSCOPY N/A 10/13/2019   Procedure: FLEXIBLE SIGMOIDOSCOPY;  Surgeon: Wilford Corner, MD;  [facility];  Service: Endoscopy;  Laterality: N/A;    There were no vitals filed for this visit.   Subjective Assessment - 11/12/19 1453    Subjective Denise Riggs reports compliance with her initial HEP.  Pain at night is most functionally limiting.    Pertinent History DM    Limitations House hold activities;Lifting    Diagnostic tests X-ray    Patient Stated Goals "be able to get back to normal"    Currently in Pain? Yes    Pain Score 4     Pain Location Shoulder    Pain Orientation Right    Pain Descriptors / Indicators Aching;Sore;Throbbing    Pain Type Chronic pain    Pain Onset More than a month ago    Pain Frequency Intermittent    Aggravating Factors  Sleeping, reaching and overhead function    Pain Relieving Factors Movement or change of position    Effect of Pain on Daily Activities Limits sleep, reaching and overhead function    Multiple Pain Sites No              [facility] PT Assessment - 11/12/19 0001      AROM   Right Shoulder Flexion 145 Degrees     Right Shoulder Internal Rotation 40 Degrees    Right Shoulder External Rotation 55 Degrees    Right Shoulder Horizontal  ADduction 30 Degrees                         [facility] Adult PT Treatment/Exercise - 11/12/19 0001      Exercises   Exercises Shoulder      Shoulder Exercises: Supine   Protraction Strengthening;Both;20 reps   3 seconds   External Rotation AROM;Right;10 reps   10 seconds   Internal Rotation AROM;Right;10 reps   10 seconds   Flexion AROM;Right;10 reps   5 seconds     Shoulder Exercises: Standing   External Rotation Strengthening;Right;10 reps;Theraband   2 sets slow eccentrics   Theraband Level (Shoulder External Rotation) Level 2 (Red)    Row Strengthening;Both;20 reps;Theraband   slow eccentrics   Theraband Level (Shoulder Row) Level 2 (Red)    Retraction Strengthening;Both;10 reps   5 seconds                 PT Education - 11/12/19 1455    Education Details Reviewed and added to HEP.    Person(s) Educated Patient  Methods Explanation;Demonstration;Tactile cues;Verbal cues;Handout    Comprehension Verbalized understanding;Returned demonstration;Verbal cues required;Need further instruction;Tactile cues required               PT Long Term Goals - 11/12/19 1456      PT LONG TERM GOAL #1   Title Pt will be able to perform HEP indepdently and progress appropriately.    Time 6    Period Weeks    Status On-going      PT LONG TERM GOAL #2   Title Pt will be able to lift and lower 10 pound object from counter to upper shelf with pain </=2/10.    Time 6    Period Weeks    Status On-going      PT LONG TERM GOAL #3   Title Pt will improve her FOTO score to </= 35% limitation.    Time 6    Period Weeks    Status On-going      PT LONG TERM GOAL #4   Title pt will be able to improve R shoulder mobility to fasten her bra.    Time 6    Period Weeks    Status On-going      PT LONG TERM GOAL #5   Title Pt will be able to  improve R shoulder strength to >/= 4+/5 to improve functional mobiltiy.    Time 6    Period Weeks    Status On-going                 Plan - 11/12/19 1458    Clinical Impression Statement Denise Riggs has capsular tightness, scapular and rotator cuff weakness that is being addressed with her physical therapy.  I expect capsular tightness and strength to improve.  How much her sleep and strength improve will likely determine if she chooses to undergo a RTC repair.    Personal Factors and Comorbidities Comorbidity 1    Comorbidities DM    Stability/Clinical Decision Making Stable/Uncomplicated    Clinical Decision Making Low    Rehab Potential Good    PT Frequency 2x / week    PT Duration 6 weeks    PT Treatment/Interventions ADLs/Self Care Home Management;Cryotherapy;Electrical Stimulation;Iontophoresis 4mg /ml Dexamethasone;Moist Heat;Ultrasound;Functional mobility training;Therapeutic activities;Therapeutic exercise;Neuromuscular re-education;Patient/family education;Manual techniques;Passive range of motion;Dry needling;Taping;Spinal Manipulations    PT Next Visit Plan Add strengthening to HEP, shoulder ROM, shoulder strengthening, modalities as needed.    PT Home Exercise Plan [email or web address]    Consulted and Agree with Plan of Care Patient           Patient will benefit from skilled therapeutic intervention in order to improve the following deficits and impairments:     Visit Diagnosis: Stiffness of right shoulder, not elsewhere classified  Muscle weakness (generalized)  Chronic right shoulder pain     Problem List Patient Active Problem List   Diagnosis Date Noted  . Rotator cuff tendinitis, right 10/29/2019  . Anterior subluxation of right shoulder 10/29/2019  . Adenomatous polyps 10/13/2019  . Pain in right shoulder 04/28/2019    Farley Ly PT, MPT 11/12/2019, 3:01 PM  [facility] Physical Therapy [address] [place], [place], [address] Phone:  [phone]   Fax:  [phone]  Name: Denise Riggs MRN: [id number] Date of Birth: Sep 04, 1965

## 2019-11-12 NOTE — Patient Instructions (Signed)
Added supine arm raise, supine IR stretch and ER theraband to HEP (See patient instructions)

## 2019-11-13 ENCOUNTER — Encounter: Payer: Self-pay | Admitting: Rehabilitative and Restorative Service Providers"

## 2019-11-13 ENCOUNTER — Ambulatory Visit (INDEPENDENT_AMBULATORY_CARE_PROVIDER_SITE_OTHER): Payer: 59 | Admitting: Rehabilitative and Restorative Service Providers"

## 2019-11-13 DIAGNOSIS — M6281 Muscle weakness (generalized): Secondary | ICD-10-CM

## 2019-11-13 DIAGNOSIS — M25611 Stiffness of right shoulder, not elsewhere classified: Secondary | ICD-10-CM

## 2019-11-13 DIAGNOSIS — G8929 Other chronic pain: Secondary | ICD-10-CM | POA: Diagnosis not present

## 2019-11-13 DIAGNOSIS — M25511 Pain in right shoulder: Secondary | ICD-10-CM | POA: Diagnosis not present

## 2019-11-13 NOTE — Therapy (Signed)
[facility] Physical Therapy [address] [place], [place], [address] Phone: [phone]   Fax:  [phone]  Physical Therapy Treatment  Patient Details  Name: Denise Riggs MRN: [id number] Date of Birth: 1965/06/21 Referring Provider (PT): Rodell Perna, MD   Encounter Date: 11/13/2019   PT End of Session - 11/13/19 0837    Visit Number 3    Number of Visits 13    Date for PT Re-Evaluation 12/23/19    PT Start Time 0800    PT Stop Time 2542    PT Time Calculation (min) 42 min    Activity Tolerance Patient tolerated treatment well;No increased pain;Patient limited by fatigue    Behavior During Therapy [facility] for tasks assessed/performed           Past Medical History:  Diagnosis Date  . Diabetes mellitus without complication [facility])     Past Surgical History:  Procedure Laterality Date  . COSMETIC SURGERY    . FLEXIBLE SIGMOIDOSCOPY N/A 10/13/2019   Procedure: FLEXIBLE SIGMOIDOSCOPY;  Surgeon: Wilford Corner, MD;  Location: [facility];  Service: Endoscopy;  Laterality: N/A;    There were no vitals filed for this visit.   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  Sleep was better last night.    Pertinent History DM    Limitations House hold activities;Lifting    Diagnostic tests X-ray    Patient Stated Goals "be able to get back to normal"    Currently in Pain? No/denies    Pain Onset More than a month ago    Aggravating Factors  Sleeping, reaching and overhead function    Pain Relieving Factors Movement and change of position    Effect of Pain on Daily Activities Limits sleep, reaching and overhead function    Multiple Pain Sites No                             [facility] Adult PT Treatment/Exercise - 11/13/19 0001      Exercises   Exercises Shoulder      Shoulder Exercises: Supine   Protraction  Strengthening;Both;20 reps   3 seconds   Protraction Weight (lbs) 3#    External Rotation AROM;Right;10 reps   10 seconds   Internal Rotation AROM;Right;10 reps   10 seconds   Flexion AROM;Right;10 reps   5 seconds     Shoulder Exercises: Standing   External Rotation Strengthening;Right;10 reps;Theraband   2 sets slow eccentrics   Theraband Level (Shoulder External Rotation) Level 2 (Red)    Row Strengthening;Both;20 reps;Theraband   slow eccentrics   Theraband Level (Shoulder Row) Level 2 (Red)    Retraction Strengthening;Both;10 reps   5 seconds     Shoulder Exercises: ROM/Strengthening   UBE (Upper Arm Bike) Push/Pull/Rest :20 intervals goal of 50 RPM 8 minutes      Shoulder Exercises: Body Blade   External Rotation 3 reps   10 seconds IR/ER                 PT Education - 11/13/19 0836    Education Details Reviewed HEP    Person(s) Educated Patient    Methods Explanation;Demonstration;Verbal cues    Comprehension Returned demonstration;Need further instruction;Verbal cues required;Verbalized understanding               PT Long Term Goals - 11/13/19 7062  PT LONG TERM GOAL #1   Title Pt will be able to perform HEP indepdently and progress appropriately.    Time 6    Period Weeks    Status On-going      PT LONG TERM GOAL #2   Title Pt will be able to lift and lower 10 pound object from counter to upper shelf with pain </=2/10.    Time 6    Period Weeks    Status On-going      PT LONG TERM GOAL #3   Title Pt will improve her FOTO score to </= 35% limitation.    Time 6    Period Weeks    Status On-going      PT LONG TERM GOAL #4   Title pt will be able to improve R shoulder mobility to fasten her bra.    Time 6    Period Weeks    Status On-going      PT LONG TERM GOAL #5   Title Pt will be able to improve R shoulder strength to >/= 4+/5 to improve functional mobiltiy.    Time 6    Period Weeks    Status On-going                  Plan - 11/13/19 4765    Clinical Impression Statement Denise Riggs needed less correction with her HEP today.  Consistent compliance will be key to meeting long term goals.  Sleep was better last night.  Focus remains on capsular flexibility, scapular and rotator cuff strength.    Personal Factors and Comorbidities Comorbidity 1    Comorbidities DM    Stability/Clinical Decision Making Stable/Uncomplicated    Rehab Potential Good    PT Frequency 2x / week    PT Duration 6 weeks    PT Treatment/Interventions ADLs/Self Care Home Management;Cryotherapy;Electrical Stimulation;Iontophoresis 4mg /ml Dexamethasone;Moist Heat;Ultrasound;Functional mobility training;Therapeutic activities;Therapeutic exercise;Neuromuscular re-education;Patient/family education;Manual techniques;Passive range of motion;Dry needling;Taping;Spinal Manipulations    PT Next Visit Plan Add strengthening to HEP, shoulder ROM, shoulder strengthening, modalities as needed.    PT Home Exercise Plan [email or web address]    Consulted and Agree with Plan of Care Patient           Patient will benefit from skilled therapeutic intervention in order to improve the following deficits and impairments:     Visit Diagnosis: Stiffness of right shoulder, not elsewhere classified  Muscle weakness (generalized)  Chronic right shoulder pain     Problem List Patient Active Problem List   Diagnosis Date Noted  . Rotator cuff tendinitis, right 10/29/2019  . Anterior subluxation of right shoulder 10/29/2019  . Adenomatous polyps 10/13/2019  . Pain in right shoulder 04/28/2019    Farley Ly PT, MPT 11/13/2019, 8:42 AM  [facility] Physical Therapy [address] [place], [place], [address] Phone: [phone]   Fax:  [phone]  Name: Denise Riggs MRN: [id number] Date of Birth: 11-26-1965

## 2019-11-17 ENCOUNTER — Ambulatory Visit (INDEPENDENT_AMBULATORY_CARE_PROVIDER_SITE_OTHER): Payer: 59 | Admitting: Physical Therapy

## 2019-11-17 ENCOUNTER — Other Ambulatory Visit: Payer: Self-pay

## 2019-11-17 ENCOUNTER — Encounter: Payer: Self-pay | Admitting: Physical Therapy

## 2019-11-17 DIAGNOSIS — M25511 Pain in right shoulder: Secondary | ICD-10-CM

## 2019-11-17 DIAGNOSIS — M6281 Muscle weakness (generalized): Secondary | ICD-10-CM

## 2019-11-17 DIAGNOSIS — M25611 Stiffness of right shoulder, not elsewhere classified: Secondary | ICD-10-CM

## 2019-11-17 DIAGNOSIS — G8929 Other chronic pain: Secondary | ICD-10-CM | POA: Diagnosis not present

## 2019-11-17 NOTE — Therapy (Signed)
[facility] Physical Therapy [address] [place], [place], [address] Phone: [phone]   Fax:  [phone]  Physical Therapy Treatment  Patient Details  Name: Denise Riggs MRN: [id number] Date of Birth: 13-Nov-1965 Referring Provider (PT): Rodell Perna, MD   Encounter Date: 11/17/2019   PT End of Session - 11/17/19 0815    Visit Number 4    Number of Visits 13    Date for PT Re-Evaluation 12/23/19    PT Start Time 0803    PT Stop Time 0843    PT Time Calculation (min) 40 min    Activity Tolerance Patient tolerated treatment well;No increased pain;Patient limited by fatigue    Behavior During Therapy [facility] for tasks assessed/performed           Past Medical History:  Diagnosis Date  . Diabetes mellitus without complication [facility])     Past Surgical History:  Procedure Laterality Date  . COSMETIC SURGERY    . FLEXIBLE SIGMOIDOSCOPY N/A 10/13/2019   Procedure: FLEXIBLE SIGMOIDOSCOPY;  Surgeon: Wilford Corner, MD;  Location: [facility];  Service: Endoscopy;  Laterality: N/A;    There were no vitals filed for this visit.   Subjective Assessment - 11/17/19 0810    Subjective Pt arriving reporting fatigue or stretch after performing her HEP. Pt reporting no pain upon arrival.    Pertinent History DM    Limitations House hold activities;Lifting    Diagnostic tests X-ray    Patient Stated Goals "be able to get back to normal"    Currently in Pain? No/denies                             [facility] Adult PT Treatment/Exercise - 11/17/19 0001      Exercises   Exercises Shoulder      Shoulder Exercises: Supine   Protraction Strengthening    Protraction Weight (lbs) 3#    External Rotation --   10 seconds   Other Supine Exercises using 3# bar, serratus punches x 20 reps      Shoulder Exercises: Standing   External Rotation Strengthening;Right;10 reps;Theraband   2 sets slow eccentrics   Theraband Level (Shoulder External  Rotation) Level 2 (Red)    Extension Strengthening;Right;15 reps;Theraband    Row Strengthening;Both;20 reps;Theraband   slow eccentrics   Theraband Level (Shoulder Row) Level 2 (Red)    Retraction Strengthening;Both;10 reps   5 seconds   Other Standing Exercises rolling ball up wall x 15 reps       Shoulder Exercises: ROM/Strengthening   UBE (Upper Arm Bike) Push/Pull/Rest :20 intervals goal of 50 RPM 8 minutes      Manual Therapy   Manual Therapy Soft tissue mobilization    Manual therapy comments percussion to R shoulder, using hypervolt x 5 minutes                  PT Education - 11/17/19 0813    Education Details Exercise technique    Person(s) Educated Patient    Methods Explanation;Demonstration    Comprehension Verbalized understanding;Returned demonstration               PT Long Term Goals - 11/13/19 0837      PT LONG TERM GOAL #1   Title Pt will be able to perform HEP indepdently and progress appropriately.    Time 6    Period Weeks    Status On-going      PT LONG TERM  GOAL #2   Title Pt will be able to lift and lower 10 pound object from counter to upper shelf with pain </=2/10.    Time 6    Period Weeks    Status On-going      PT LONG TERM GOAL #3   Title Pt will improve her FOTO score to </= 35% limitation.    Time 6    Period Weeks    Status On-going      PT LONG TERM GOAL #4   Title pt will be able to improve R shoulder mobility to fasten her bra.    Time 6    Period Weeks    Status On-going      PT LONG TERM GOAL #5   Title Pt will be able to improve R shoulder strength to >/= 4+/5 to improve functional mobiltiy.    Time 6    Period Weeks    Status On-going                 Plan - 11/17/19 0815    Clinical Impression Statement Pt arriving to therapy reporting no pain today. Pt has been consistent with her HEP. Pt tolerating all exercises well today still focusing on strength and endurance with R shoulder. Pt with "rubber  band" feel when performing resistive IR using red theraband. Following percussion using hypervolt pt reporting less "popping". Continiue skilled PT.    Personal Factors and Comorbidities Comorbidity 1    Comorbidities DM    Examination-Activity Limitations Lift;Reach Overhead;Sleep    Examination-Participation Restrictions Community Activity;Cleaning    Rehab Potential Good    PT Frequency 2x / week    PT Duration 6 weeks    PT Treatment/Interventions ADLs/Self Care Home Management;Cryotherapy;Electrical Stimulation;Iontophoresis 4mg /ml Dexamethasone;Moist Heat;Ultrasound;Functional mobility training;Therapeutic activities;Therapeutic exercise;Neuromuscular re-education;Patient/family education;Manual techniques;Passive range of motion;Dry needling;Taping;Spinal Manipulations    PT Next Visit Plan Add strengthening to HEP, shoulder ROM, shoulder strengthening, modalities as needed.    PT Home Exercise Plan [email or web address]    Consulted and Agree with Plan of Care Patient           Patient will benefit from skilled therapeutic intervention in order to improve the following deficits and impairments:  Pain, Postural dysfunction, Decreased strength, Impaired UE functional use  Visit Diagnosis: Stiffness of right shoulder, not elsewhere classified  Muscle weakness (generalized)  Chronic right shoulder pain  Acute pain of right shoulder     Problem List Patient Active Problem List   Diagnosis Date Noted  . Rotator cuff tendinitis, right 10/29/2019  . Anterior subluxation of right shoulder 10/29/2019  . Adenomatous polyps 10/13/2019  . Pain in right shoulder 04/28/2019    Oretha Caprice, PT, MPT 11/17/2019, 8:48 AM  [facility] Physical Therapy [address] [place], [place], [address] Phone: [phone]   Fax:  [phone]  Name: Denise Riggs MRN: [id number] Date of Birth: 1965-07-25

## 2019-11-19 ENCOUNTER — Ambulatory Visit (INDEPENDENT_AMBULATORY_CARE_PROVIDER_SITE_OTHER): Payer: 59 | Admitting: Rehabilitative and Restorative Service Providers"

## 2019-11-19 ENCOUNTER — Encounter: Payer: Self-pay | Admitting: Rehabilitative and Restorative Service Providers"

## 2019-11-19 ENCOUNTER — Other Ambulatory Visit: Payer: Self-pay

## 2019-11-19 DIAGNOSIS — M6281 Muscle weakness (generalized): Secondary | ICD-10-CM | POA: Diagnosis not present

## 2019-11-19 DIAGNOSIS — M25611 Stiffness of right shoulder, not elsewhere classified: Secondary | ICD-10-CM

## 2019-11-19 DIAGNOSIS — M25561 Pain in right knee: Secondary | ICD-10-CM

## 2019-11-19 DIAGNOSIS — G8929 Other chronic pain: Secondary | ICD-10-CM | POA: Diagnosis not present

## 2019-11-19 NOTE — Therapy (Signed)
[facility] Physical Therapy [address] [place], [place], [address] Phone: [phone]   Fax:  [phone]  Physical Therapy Treatment  Patient Details  Name: Denise Riggs MRN: [id number] Date of Birth: 19-Jan-1965 Referring Provider (PT): Rodell Perna, MD   Encounter Date: 11/19/2019   PT End of Session - 11/19/19 0925    Visit Number 5    Number of Visits 13    Date for PT Re-Evaluation 12/23/19    PT Start Time 2992    PT Stop Time 0928    PT Time Calculation (min) 44 min    Activity Tolerance Patient tolerated treatment well;No increased pain;Patient limited by fatigue    Behavior During Therapy [facility] for tasks assessed/performed           Past Medical History:  Diagnosis Date  . Diabetes mellitus without complication [facility])     Past Surgical History:  Procedure Laterality Date  . COSMETIC SURGERY    . FLEXIBLE SIGMOIDOSCOPY N/A 10/13/2019   Procedure: FLEXIBLE SIGMOIDOSCOPY;  Surgeon: Wilford Corner, MD;  Location: [facility];  Service: Endoscopy;  Laterality: N/A;    There were no vitals filed for this visit.   Subjective Assessment - 11/19/19 0850    Subjective Denise Riggs reports better sleep and more confidence with using her R shoulder over the past week.    Pertinent History DM    Limitations House hold activities;Lifting    Diagnostic tests X-ray    Patient Stated Goals "be able to get back to normal"    Currently in Pain? Yes    Pain Score 1     Pain Location Shoulder    Pain Orientation Right    Pain Descriptors / Indicators Aching;Tightness    Pain Type Chronic pain    Pain Onset More than a month ago    Pain Frequency Intermittent    Aggravating Factors  Sleeping now 5 hours uninterrupted (was less).  Using arm more often but still limited.    Pain Relieving Factors Ibuprofen    Effect of Pain on Daily Activities Unable to do normal activities at the pace she would like.  Sleep better but still uninterrupted.    Multiple  Pain Sites No                             [facility] Adult PT Treatment/Exercise - 11/19/19 0001      Exercises   Exercises Shoulder      Shoulder Exercises: Supine   Protraction Strengthening;20 reps   slow eccentrics   Protraction Weight (lbs) 3#    External Rotation AROM;Right;10 reps   10 seconds   Internal Rotation AROM;Right;10 reps   10 seconds   Flexion AROM;Right;10 reps   5 seconds     Shoulder Exercises: Standing   External Rotation Strengthening;Right;10 reps;Theraband   2 sets slow eccentrics   Theraband Level (Shoulder External Rotation) Level 2 (Red)    Row Strengthening;Both;20 reps;Theraband   slow eccentrics   Theraband Level (Shoulder Row) Level 4 (Blue)    Retraction Strengthening;Both;10 reps   5 seconds     Shoulder Exercises: ROM/Strengthening   UBE (Upper Arm Bike) Push/Pull/Rest :20 intervals goal of 50 RPM 8 minutes                  PT Education - 11/19/19 0923    Education Details Reviewed HEP    Person(s) Educated Patient    Methods Explanation;Demonstration;Tactile cues;Verbal cues  Comprehension Verbal cues required;Need further instruction;Returned demonstration;Verbalized understanding               PT Long Term Goals - 11/19/19 0924      PT LONG TERM GOAL #1   Title Pt will be able to perform HEP indepdently and progress appropriately.    Time 6    Period Weeks    Status On-going      PT LONG TERM GOAL #2   Title Pt will be able to lift and lower 10 pound object from counter to upper shelf with pain </=2/10.    Time 6    Period Weeks    Status On-going      PT LONG TERM GOAL #3   Title Pt will improve her FOTO score to </= 35% limitation.    Time 6    Period Weeks    Status On-going      PT LONG TERM GOAL #4   Title Pt will be able to improve R shoulder mobility to fasten her bra.    Time 6    Period Weeks    Status Achieved      PT LONG TERM GOAL #5   Title Pt will be able to improve R  shoulder strength to >/= 4+/5 to improve functional mobiltiy.    Time 6    Period Weeks    Status On-going                 Plan - 11/19/19 0925    Clinical Impression Statement Denise Riggs reports good HEP compliance.  Sleep quality is improving and she notes less difficulty with some R shoulder use.  Overall function is still limited, particularly with overhead and reaching activities.  Continue POC.    Personal Factors and Comorbidities Comorbidity 1    Comorbidities DM    Examination-Activity Limitations Lift;Reach Overhead;Sleep    Examination-Participation Restrictions Community Activity;Cleaning    Rehab Potential Good    PT Frequency 2x / week    PT Duration 6 weeks    PT Treatment/Interventions ADLs/Self Care Home Management;Cryotherapy;Electrical Stimulation;Iontophoresis 4mg /ml Dexamethasone;Moist Heat;Ultrasound;Functional mobility training;Therapeutic activities;Therapeutic exercise;Neuromuscular re-education;Patient/family education;Manual techniques;Passive range of motion;Dry needling;Taping;Spinal Manipulations    PT Next Visit Plan Add strengthening to HEP, shoulder ROM, shoulder strengthening, modalities as needed.    PT Home Exercise Plan [email or web address]    Consulted and Agree with Plan of Care Patient           Patient will benefit from skilled therapeutic intervention in order to improve the following deficits and impairments:  Pain, Postural dysfunction, Decreased strength, Impaired UE functional use  Visit Diagnosis: Chronic pain of right knee  Stiffness of right shoulder, not elsewhere classified  Muscle weakness (generalized)     Problem List Patient Active Problem List   Diagnosis Date Noted  . Rotator cuff tendinitis, right 10/29/2019  . Anterior subluxation of right shoulder 10/29/2019  . Adenomatous polyps 10/13/2019  . Pain in right shoulder 04/28/2019    Farley Ly PT, MPT 11/19/2019, 9:28 AM  [facility] Physical Therapy [address] [place], [place], [address] Phone: [phone]   Fax:  [phone]  Name: Denise Riggs MRN: [id number] Date of Birth: 1965/07/22

## 2019-11-19 NOTE — Patient Instructions (Signed)
Stay with same HEP.

## 2019-11-23 ENCOUNTER — Other Ambulatory Visit: Payer: Self-pay

## 2019-11-23 ENCOUNTER — Ambulatory Visit (INDEPENDENT_AMBULATORY_CARE_PROVIDER_SITE_OTHER): Payer: 59 | Admitting: Physical Therapy

## 2019-11-23 ENCOUNTER — Encounter: Payer: Self-pay | Admitting: Physical Therapy

## 2019-11-23 DIAGNOSIS — M25611 Stiffness of right shoulder, not elsewhere classified: Secondary | ICD-10-CM

## 2019-11-23 DIAGNOSIS — M25561 Pain in right knee: Secondary | ICD-10-CM

## 2019-11-23 DIAGNOSIS — M25511 Pain in right shoulder: Secondary | ICD-10-CM

## 2019-11-23 DIAGNOSIS — G8929 Other chronic pain: Secondary | ICD-10-CM

## 2019-11-23 DIAGNOSIS — M6281 Muscle weakness (generalized): Secondary | ICD-10-CM | POA: Diagnosis not present

## 2019-11-23 NOTE — Therapy (Signed)
[facility] Physical Therapy [address] [place], [place], [address] Phone: [phone]   Fax:  [phone]  Physical Therapy Treatment  Patient Details  Name: Denise Riggs MRN: [id number] Date of Birth: January 05, 1966 Referring Provider (PT): Rodell Perna, MD   Encounter Date: 11/23/2019   PT End of Session - 11/23/19 0855    Visit Number 6    Number of Visits 13    Date for PT Re-Evaluation 12/23/19    PT Start Time 0848    PT Stop Time 0932    PT Time Calculation (min) 44 min    Activity Tolerance Patient tolerated treatment well;No increased pain;Patient limited by fatigue    Behavior During Therapy [facility] for tasks assessed/performed           Past Medical History:  Diagnosis Date  . Diabetes mellitus without complication [facility])     Past Surgical History:  Procedure Laterality Date  . COSMETIC SURGERY    . FLEXIBLE SIGMOIDOSCOPY N/A 10/13/2019   Procedure: FLEXIBLE SIGMOIDOSCOPY;  Surgeon: Wilford Corner, MD;  [facility];  Service: Endoscopy;  Laterality: N/A;    There were no vitals filed for this visit.   Subjective Assessment - 11/23/19 0854    Subjective Pt arriving reporting 1/10 pain today with more stiffness in the morning when waking.    Pertinent History DM    Limitations House hold activities;Lifting    Diagnostic tests X-ray    Patient Stated Goals "be able to get back to normal"    Currently in Pain? Yes    Pain Score 1     Pain Location Shoulder    Pain Orientation Right    Pain Descriptors / Indicators Tightness;Sore    Pain Type Chronic pain    Pain Onset More than a month ago                             [facility] Adult PT Treatment/Exercise - 11/23/19 0001      Exercises   Exercises Shoulder      Shoulder Exercises: Supine   Protraction Strengthening;20 reps   slow eccentrics   Protraction Weight (lbs) 3#    Internal Rotation AROM;Right;10 reps   10 seconds   Flexion AROM;Right;10  reps   5 seconds   Flexion Limitations using 2 # bar      Shoulder Exercises: Standing   External Rotation Strengthening;Right;10 reps;Theraband   2 sets slow eccentrics   Theraband Level (Shoulder External Rotation) Level 2 (Red)    Extension AAROM;Strengthening;Both;12 reps;Weights    Extension Weight (lbs) 2# bar    Row Strengthening;Both;20 reps;Theraband   slow eccentrics   Theraband Level (Shoulder Row) Level 4 (Blue)    Retraction Strengthening;Both;10 reps   5 seconds     Shoulder Exercises: ROM/Strengthening   UBE (Upper Arm Bike) 4 minutes forward and back ( 8 minute total), L 1.5, goal of >/= 50 RPM      Shoulder Exercises: Stretch   Other Shoulder Stretches door stretch elbow and shoulder at 90 degrees x 10 holding 10 seconds each      Modalities   Modalities Moist Heat      Moist Heat Therapy   Number Minutes Moist Heat 5 Minutes    Moist Heat Location Cervical                       PT Long Term Goals - 11/23/19 3151  PT LONG TERM GOAL #1   Title Pt will be able to perform HEP indepdently and progress appropriately.    Status On-going      PT LONG TERM GOAL #2   Title Pt will be able to lift and lower 10 pound object from counter to upper shelf with pain </=2/10.    Status On-going      PT LONG TERM GOAL #3   Title Pt will improve her FOTO score to </= 35% limitation.    Status On-going      PT LONG TERM GOAL #4   Title Pt will be able to improve R shoulder mobility to fasten her bra.    Status On-going      PT LONG TERM GOAL #5   Title Pt will be able to improve R shoulder strength to >/= 4+/5 to improve functional mobiltiy.    Status On-going                 Plan - 11/23/19 0903    Clinical Impression Statement Pt arriving reporting 1/10 pain in R shoulder. Pt still reporting stiffness when first waking up. Pt reported the heat from the shower helped. Pt still taking ibroprofen intermittently for pain control at times.  Progressing with strengtheing and overhead activities. Continue skilled PT.    Personal Factors and Comorbidities Comorbidity 1    Comorbidities DM    Examination-Activity Limitations Lift;Reach Overhead;Sleep    Examination-Participation Restrictions Community Activity;Cleaning    Stability/Clinical Decision Making Stable/Uncomplicated    Rehab Potential Good    PT Frequency 2x / week    PT Duration 6 weeks    PT Treatment/Interventions ADLs/Self Care Home Management;Cryotherapy;Electrical Stimulation;Iontophoresis 4mg /ml Dexamethasone;Moist Heat;Ultrasound;Functional mobility training;Therapeutic activities;Therapeutic exercise;Neuromuscular re-education;Patient/family education;Manual techniques;Passive range of motion;Dry needling;Taping;Spinal Manipulations    PT Next Visit Plan Add strengthening to HEP, shoulder ROM, shoulder strengthening, modalities as needed.    PT Home Exercise Plan [email or web address]    Consulted and Agree with Plan of Care Patient           Patient will benefit from skilled therapeutic intervention in order to improve the following deficits and impairments:  Pain, Postural dysfunction, Decreased strength, Impaired UE functional use  Visit Diagnosis: Chronic pain of right knee  Stiffness of right shoulder, not elsewhere classified  Muscle weakness (generalized)  Chronic right shoulder pain  Acute pain of right shoulder     Problem List Patient Active Problem List   Diagnosis Date Noted  . Rotator cuff tendinitis, right 10/29/2019  . Anterior subluxation of right shoulder 10/29/2019  . Adenomatous polyps 10/13/2019  . Pain in right shoulder 04/28/2019    Oretha Caprice, PT, MPT 11/23/2019, 9:27 AM  [facility] Physical Therapy [address] [place], [place], [address] Phone: [phone]   Fax:  [phone]  Name: Denise Riggs MRN: [id number] Date of Birth: [birth year]

## 2019-11-25 ENCOUNTER — Ambulatory Visit (INDEPENDENT_AMBULATORY_CARE_PROVIDER_SITE_OTHER): Payer: 59 | Admitting: Physical Therapy

## 2019-11-25 ENCOUNTER — Encounter: Payer: Self-pay | Admitting: Physical Therapy

## 2019-11-25 ENCOUNTER — Other Ambulatory Visit: Payer: Self-pay

## 2019-11-25 DIAGNOSIS — M6281 Muscle weakness (generalized): Secondary | ICD-10-CM | POA: Diagnosis not present

## 2019-11-25 DIAGNOSIS — G8929 Other chronic pain: Secondary | ICD-10-CM

## 2019-11-25 DIAGNOSIS — M25611 Stiffness of right shoulder, not elsewhere classified: Secondary | ICD-10-CM

## 2019-11-25 DIAGNOSIS — M25561 Pain in right knee: Secondary | ICD-10-CM | POA: Diagnosis not present

## 2019-11-25 DIAGNOSIS — M25511 Pain in right shoulder: Secondary | ICD-10-CM | POA: Diagnosis not present

## 2019-11-25 NOTE — Therapy (Signed)
[facility] Physical Therapy [address] [place], [place], [address] Phone: [phone]   Fax:  [phone]  Physical Therapy Treatment  Patient Details  Name: Denise Riggs MRN: [id number] Date of Birth: 04-08-1965 Referring Provider (PT): Rodell Perna, MD   Encounter Date: 11/25/2019   PT End of Session - 11/25/19 0952    Visit Number 7    Number of Visits 13    Date for PT Re-Evaluation 12/23/19    PT Start Time 0945    PT Stop Time 4481    PT Time Calculation (min) 30 min    Activity Tolerance Patient tolerated treatment well;No increased pain;Patient limited by fatigue    Behavior During Therapy [facility] for tasks assessed/performed           Past Medical History:  Diagnosis Date   Diabetes mellitus without complication ([place])     Past Surgical History:  Procedure Laterality Date   COSMETIC SURGERY     FLEXIBLE SIGMOIDOSCOPY N/A 10/13/2019   Procedure: FLEXIBLE SIGMOIDOSCOPY;  Surgeon: Wilford Corner, MD;  [facility];  Service: Endoscopy;  Laterality: N/A;    There were no vitals filed for this visit.   Subjective Assessment - 11/25/19 0950    Subjective Pt arriving 15 minutes late due to flat tire. Pt reporting 1-2/10 tightness in R shoulder. Pt also reporting "popping" when she performs side to side motions with her shoulder.    Pertinent History DM    Limitations House hold activities;Lifting    Diagnostic tests X-ray    Patient Stated Goals "be able to get back to normal"    Currently in Pain? Yes    Pain Score 2     Pain Location Shoulder    Pain Orientation Right    Pain Descriptors / Indicators Tightness    Pain Type Chronic pain    Pain Onset More than a month ago                             [facility] Adult PT Treatment/Exercise - 11/25/19 0001      Exercises   Exercises Shoulder      Shoulder Exercises: Supine   Internal Rotation AROM;Right;10 reps   10 seconds   Flexion AROM;Right;10  reps   5 seconds   Flexion Limitations using 2 # bar      Shoulder Exercises: Standing   External Rotation Strengthening;Right;10 reps;Theraband   2 sets slow eccentrics   Theraband Level (Shoulder External Rotation) Level 2 (Red)    Extension AAROM;Strengthening;Both;12 reps;Weights    Extension Weight (lbs) 2# bar    Row Strengthening;Both;20 reps;Theraband   slow eccentrics   Theraband Level (Shoulder Row) Level 3 (Green)   green today due to popping sensation noted,    Retraction Strengthening;Both;10 reps   5 seconds     Shoulder Exercises: ROM/Strengthening   UBE (Upper Arm Bike) 4 minutes forward and back ( 8 minute total), L 1.5, goal of >/= 50 RPM      Shoulder Exercises: Stretch   Other Shoulder Stretches door stretch elbow and shoulder at 90 degrees x 10 holding 10 seconds each                       PT Long Term Goals - 11/23/19 0856      PT LONG TERM GOAL #1   Title Pt will be able to perform HEP indepdently and progress appropriately.  Status On-going      PT LONG TERM GOAL #2   Title Pt will be able to lift and lower 10 pound object from counter to upper shelf with pain </=2/10.    Status On-going      PT LONG TERM GOAL #3   Title Pt will improve her FOTO score to </= 35% limitation.    Status On-going      PT LONG TERM GOAL #4   Title Pt will be able to improve R shoulder mobility to fasten her bra.    Status On-going      PT LONG TERM GOAL #5   Title Pt will be able to improve R shoulder strength to >/= 4+/5 to improve functional mobiltiy.    Status On-going                 Plan - 11/25/19 0953    Clinical Impression Statement Pt tolerating therapy well and making progress with strengtheing and ROM. Pt still reporting stiffness first thing in the morning and toward the end of the day. Posture correction with exercises performed. Continue to progress as pt tolerates toward LTG's.    Personal Factors and Comorbidities Comorbidity 1     Comorbidities DM    Examination-Activity Limitations Lift;Reach Overhead;Sleep    Examination-Participation Restrictions Community Activity;Cleaning    Stability/Clinical Decision Making Stable/Uncomplicated    Rehab Potential Good    PT Duration 6 weeks    PT Treatment/Interventions ADLs/Self Care Home Management;Cryotherapy;Electrical Stimulation;Iontophoresis 4mg /ml Dexamethasone;Moist Heat;Ultrasound;Functional mobility training;Therapeutic activities;Therapeutic exercise;Neuromuscular re-education;Patient/family education;Manual techniques;Passive range of motion;Dry needling;Taping;Spinal Manipulations    PT Next Visit Plan Add strengthening to HEP, shoulder ROM, shoulder strengthening, modalities as needed.    PT Home Exercise Plan [email or web address]    Consulted and Agree with Plan of Care Patient           Patient will benefit from skilled therapeutic intervention in order to improve the following deficits and impairments:  Pain, Postural dysfunction, Decreased strength, Impaired UE functional use  Visit Diagnosis: Chronic pain of right knee  Stiffness of right shoulder, not elsewhere classified  Muscle weakness (generalized)  Chronic right shoulder pain  Acute pain of right shoulder     Problem List Patient Active Problem List   Diagnosis Date Noted   Rotator cuff tendinitis, right 10/29/2019   Anterior subluxation of right shoulder 10/29/2019   Adenomatous polyps 10/13/2019   Pain in right shoulder 04/28/2019    Oretha Caprice, PT, MPT 11/25/2019, 10:07 AM  [facility] Physical Therapy [address] [place], [place], [address] Phone: [phone]   Fax:  [phone]  Name: Denise Riggs MRN: [id number] Date of Birth: Jul 30, 1965

## 2019-11-30 ENCOUNTER — Other Ambulatory Visit: Payer: Self-pay

## 2019-11-30 ENCOUNTER — Ambulatory Visit (INDEPENDENT_AMBULATORY_CARE_PROVIDER_SITE_OTHER): Payer: 59 | Admitting: Physical Therapy

## 2019-11-30 ENCOUNTER — Encounter: Payer: Self-pay | Admitting: Physical Therapy

## 2019-11-30 DIAGNOSIS — M25611 Stiffness of right shoulder, not elsewhere classified: Secondary | ICD-10-CM | POA: Diagnosis not present

## 2019-11-30 DIAGNOSIS — M25511 Pain in right shoulder: Secondary | ICD-10-CM | POA: Diagnosis not present

## 2019-11-30 DIAGNOSIS — M25561 Pain in right knee: Secondary | ICD-10-CM

## 2019-11-30 DIAGNOSIS — M6281 Muscle weakness (generalized): Secondary | ICD-10-CM

## 2019-11-30 DIAGNOSIS — G8929 Other chronic pain: Secondary | ICD-10-CM

## 2019-11-30 NOTE — Therapy (Signed)
[facility] Physical Therapy [address] [place], [place], [address] Phone: [phone]   Fax:  [phone]  Physical Therapy Treatment  Patient Details  Name: Denise Riggs MRN: [id number] Date of Birth: March 30, 1965 Referring Provider (PT): Rodell Perna MD   Encounter Date: 11/30/2019   PT End of Session - 11/30/19 0828    Visit Number 8    Number of Visits 13    Date for PT Re-Evaluation 12/23/19    PT Start Time 0804    PT Stop Time 6301    PT Time Calculation (min) 39 min    Activity Tolerance Patient tolerated treatment well;No increased pain;Patient limited by fatigue    Behavior During Therapy [facility] for tasks assessed/performed           Past Medical History:  Diagnosis Date  . Diabetes mellitus without complication [facility])     Past Surgical History:  Procedure Laterality Date  . COSMETIC SURGERY    . FLEXIBLE SIGMOIDOSCOPY N/A 10/13/2019   Procedure: FLEXIBLE SIGMOIDOSCOPY;  Surgeon: Wilford Corner, MD;  Location: [facility];  Service: Endoscopy;  Laterality: N/A;    There were no vitals filed for this visit.   Subjective Assessment - 11/30/19 0814    Subjective Pt arriving reporting soreness after working on her green house this weekend. Pt still reporting "popping" with abduction movements. Pt reporting sleeping is a little better, but still having some problems with finding comforable position.    Pertinent History DM    Limitations House hold activities;Lifting    Diagnostic tests X-ray    Patient Stated Goals "be able to get back to normal"    Currently in Pain? Yes    Pain Score 2     Pain Location Shoulder    Pain Orientation Right    Pain Descriptors / Indicators Sore    Pain Type Chronic pain    Pain Onset More than a month ago              [facility] PT Assessment - 11/30/19 0001      Assessment   Medical Diagnosis M25.511, R shoulder    Referring Provider (PT) Rodell Perna MD    Onset Date/Surgical Date 04/20/19                          [facility] Adult PT Treatment/Exercise - 11/30/19 0001      Exercises   Exercises Shoulder      Shoulder Exercises: Supine   Internal Rotation AROM;Right;10 reps   10 seconds   Flexion AROM;Right;10 reps   5 seconds   Flexion Limitations using 2 # bar      Shoulder Exercises: Standing   External Rotation Strengthening;Right;10 reps;Theraband   2 sets slow eccentrics   Theraband Level (Shoulder External Rotation) Level 3 (Green)    Extension AAROM;Strengthening;Both;12 reps;Weights    Extension Weight (lbs) 3# bar    Row Strengthening;Both;20 reps;Theraband   slow eccentrics   Theraband Level (Shoulder Row) Level 4 (Blue)   green today due to popping sensation noted,    Retraction Strengthening;Both;10 reps   5 seconds     Shoulder Exercises: ROM/Strengthening   UBE (Upper Arm Bike) 4 minutes forward and back ( 8 minute total), L 1.5, goal of >/= 50 RPM                       PT Long Term Goals - 11/23/19 6010  PT LONG TERM GOAL #1   Title Pt will be able to perform HEP indepdently and progress appropriately.    Status On-going      PT LONG TERM GOAL #2   Title Pt will be able to lift and lower 10 pound object from counter to upper shelf with pain </=2/10.    Status On-going      PT LONG TERM GOAL #3   Title Pt will improve her FOTO score to </= 35% limitation.    Status On-going      PT LONG TERM GOAL #4   Title Pt will be able to improve R shoulder mobility to fasten her bra.    Status On-going      PT LONG TERM GOAL #5   Title Pt will be able to improve R shoulder strength to >/= 4+/5 to improve functional mobiltiy.    Status On-going                 Plan - 11/30/19 0828    Clinical Impression Statement Pt tolreating exercises well today. Pt still reporting mild "popping" sensation with abduction and extension exercises but reports it's better. We progressesd strengthening. Continue to progress toward goals  set.    Personal Factors and Comorbidities Comorbidity 1    Comorbidities DM    Examination-Activity Limitations Lift;Reach Overhead;Sleep    Examination-Participation Restrictions Community Activity;Cleaning    Stability/Clinical Decision Making Stable/Uncomplicated    Rehab Potential Good    PT Frequency 2x / week    PT Duration 6 weeks    PT Treatment/Interventions ADLs/Self Care Home Management;Cryotherapy;Electrical Stimulation;Iontophoresis 4mg /ml Dexamethasone;Moist Heat;Ultrasound;Functional mobility training;Therapeutic activities;Therapeutic exercise;Neuromuscular re-education;Patient/family education;Manual techniques;Passive range of motion;Dry needling;Taping;Spinal Manipulations    PT Next Visit Plan Add strengthening to HEP, shoulder ROM, shoulder strengthening, modalities as needed.    PT Home Exercise Plan [facility]           Patient will benefit from skilled therapeutic intervention in order to improve the following deficits and impairments:  Pain, Postural dysfunction, Decreased strength, Impaired UE functional use  Visit Diagnosis: Chronic pain of right knee  Stiffness of right shoulder, not elsewhere classified  Muscle weakness (generalized)  Chronic right shoulder pain  Acute pain of right shoulder     Problem List Patient Active Problem List   Diagnosis Date Noted  . Rotator cuff tendinitis, right 10/29/2019  . Anterior subluxation of right shoulder 10/29/2019  . Adenomatous polyps 10/13/2019  . Pain in right shoulder 04/28/2019    Oretha Caprice, PT, MPT 11/30/2019, 8:44 AM  [facility] Physical Therapy [address] [place], [place], [address] Phone: [phone]   Fax:  [phone]  Name: Denise Riggs MRN: [id number] Date of Birth: 1965/01/10

## 2019-12-01 ENCOUNTER — Encounter: Payer: Self-pay | Admitting: Orthopaedic Surgery

## 2019-12-01 ENCOUNTER — Encounter: Payer: Self-pay | Admitting: Physical Therapy

## 2019-12-01 ENCOUNTER — Ambulatory Visit (INDEPENDENT_AMBULATORY_CARE_PROVIDER_SITE_OTHER): Payer: 59 | Admitting: Physical Therapy

## 2019-12-01 ENCOUNTER — Ambulatory Visit (INDEPENDENT_AMBULATORY_CARE_PROVIDER_SITE_OTHER): Payer: 59 | Admitting: Orthopaedic Surgery

## 2019-12-01 VITALS — BP 132/77 | HR 81 | Ht 67.0 in | Wt 227.0 lb

## 2019-12-01 DIAGNOSIS — M6281 Muscle weakness (generalized): Secondary | ICD-10-CM | POA: Diagnosis not present

## 2019-12-01 DIAGNOSIS — M25611 Stiffness of right shoulder, not elsewhere classified: Secondary | ICD-10-CM | POA: Diagnosis not present

## 2019-12-01 DIAGNOSIS — G8929 Other chronic pain: Secondary | ICD-10-CM

## 2019-12-01 DIAGNOSIS — M7581 Other shoulder lesions, right shoulder: Secondary | ICD-10-CM

## 2019-12-01 DIAGNOSIS — M25561 Pain in right knee: Secondary | ICD-10-CM | POA: Diagnosis not present

## 2019-12-01 DIAGNOSIS — S43011S Anterior subluxation of right humerus, sequela: Secondary | ICD-10-CM | POA: Diagnosis not present

## 2019-12-01 DIAGNOSIS — M25511 Pain in right shoulder: Secondary | ICD-10-CM | POA: Diagnosis not present

## 2019-12-01 NOTE — Therapy (Signed)
[facility] Physical Therapy [address] [place], [place], [address] Phone: [phone]   Fax:  [phone]  Physical Therapy Treatment  Patient Details  Name: Denise Riggs MRN: [id number] Date of Birth: 07-13-1965 Referring Provider (PT): Rodell Perna MD   Encounter Date: 12/01/2019   PT End of Session - 12/01/19 0934    Visit Number 9    Number of Visits 13    Date for PT Re-Evaluation 12/23/19    PT Start Time 0929    PT Stop Time 1010    PT Time Calculation (min) 41 min    Activity Tolerance Patient tolerated treatment well;No increased pain;Patient limited by fatigue    Behavior During Therapy [facility] for tasks assessed/performed           Past Medical History:  Diagnosis Date   Diabetes mellitus without complication ([place])     Past Surgical History:  Procedure Laterality Date   COSMETIC SURGERY     FLEXIBLE SIGMOIDOSCOPY N/A 10/13/2019   Procedure: FLEXIBLE SIGMOIDOSCOPY;  Surgeon: Wilford Corner, MD;  Location: [facility];  Service: Endoscopy;  Laterality: N/A;    There were no vitals filed for this visit.   Subjective Assessment - 12/01/19 0930    Subjective Pt saw Dr. Lorin Riggs this morning and reporting he wants her to continue therapy and continue to progress functional mobility. Pt reporting heaviness in the front of her shoulder.    Pertinent History DM    Limitations House hold activities;Lifting    Diagnostic tests X-ray    Patient Stated Goals "be able to get back to normal"    Currently in Pain? Yes    Pain Score 2     Pain Location Other (Comment)    Pain Orientation Right    Pain Descriptors / Indicators Sore    Pain Type Chronic pain    Pain Onset More than a month ago              [facility] PT Assessment - 12/01/19 0001      Assessment   Medical Diagnosis M25.511, R shoulder    Referring Provider (PT) Rodell Perna MD    Onset Date/Surgical Date 04/20/19                         [facility] Adult PT  Treatment/Exercise - 12/01/19 0001      Exercises   Exercises Shoulder      Shoulder Exercises: Supine   Internal Rotation AROM;Right;10 reps   10 seconds   Flexion AROM;Right;10 reps   5 seconds   Flexion Limitations 3# bar      Shoulder Exercises: Standing   External Rotation Strengthening;Right;10 reps;Theraband   2 sets slow eccentrics   Theraband Level (Shoulder External Rotation) Level 3 (Green)    Extension AAROM;Strengthening;Both;12 reps;Weights    Extension Weight (lbs) 3# bar    Row Strengthening;Both;20 reps;Theraband   slow eccentrics   Theraband Level (Shoulder Row) Level 4 (Blue)   green today due to popping sensation noted,    Other Standing Exercises placing 2# weight on shoulder height shelf x10, placing 3# weight shoulder height x 10, placing 2# and 3# weight on shelf above shoulder height x 10 each      Shoulder Exercises: ROM/Strengthening   UBE (Upper Arm Bike) 4 minutes forward and back ( 8 minute total), L 1.5, goal of >/= 50 RPM      Shoulder Exercises: Stretch   Other Shoulder Stretches Door stretch x  3 holding 10 seconds each      Shoulder Exercises: Power Statistician rows: 15# 2x10                       PT Long Term Goals - 12/01/19 2518      PT LONG TERM GOAL #1   Title Pt will be able to perform HEP indepdently and progress appropriately.    Status On-going      PT LONG TERM GOAL #2   Title Pt will be able to lift and lower 10 pound object from counter to upper shelf with pain </=2/10.    Status On-going      PT LONG TERM GOAL #3   Title Pt will improve her FOTO score to </= 35% limitation.    Status On-going      PT LONG TERM GOAL #4   Title Pt will be able to improve R shoulder mobility to fasten her bra.    Baseline Pt stating she can do the movement but still reporting tighness.    Status Partially Met      PT LONG TERM GOAL #5   Title Pt will be able to improve R shoulder strength to >/= 4+/5 to  improve functional mobiltiy.    Status On-going                 Plan - 12/01/19 0957    Clinical Impression Statement Pt tolerating treatment well progressing more on strenghtening and endurance. No popping sensation noted during this treatment. Continue skilled PT to progress toward LTG's.    Personal Factors and Comorbidities Comorbidity 1    Examination-Activity Limitations Lift;Reach Overhead;Sleep    Examination-Participation Restrictions Community Activity;Cleaning    Stability/Clinical Decision Making Stable/Uncomplicated    Rehab Potential Good    PT Frequency 2x / week    PT Duration 6 weeks    PT Treatment/Interventions ADLs/Self Care Home Management;Cryotherapy;Electrical Stimulation;Iontophoresis 68m/ml Dexamethasone;Moist Heat;Ultrasound;Functional mobility training;Therapeutic activities;Therapeutic exercise;Neuromuscular re-education;Patient/family education;Manual techniques;Passive range of motion;Dry needling;Taping;Spinal Manipulations    PT Next Visit Plan Add strengthening to HEP, shoulder ROM, shoulder strengthening, modalities as needed.    PT Home Exercise Plan [email or web address]    Consulted and Agree with Plan of Care Patient           Patient will benefit from skilled therapeutic intervention in order to improve the following deficits and impairments:  Pain, Postural dysfunction, Decreased strength, Impaired UE functional use  Visit Diagnosis: Chronic pain of right knee  Stiffness of right shoulder, not elsewhere classified  Muscle weakness (generalized)  Chronic right shoulder pain  Acute pain of right shoulder     Problem List Patient Active Problem List   Diagnosis Date Noted   Rotator cuff tendinitis, right 10/29/2019   Anterior subluxation of right shoulder 10/29/2019   Adenomatous polyps 10/13/2019   Pain in right shoulder 04/28/2019    JOretha Caprice PT, MPT 12/01/2019, 10:12 AM  [facility] [address] [address]Phone: [phone]  Fax:  [phone] Name: KNathasha FiorilloMRN: [id number] of Birth: 4July 09, 1967

## 2019-12-01 NOTE — Progress Notes (Signed)
Office Visit Note   Patient: Denise Riggs           Date of Birth: 12/09/1965           MRN: [id number] Visit Date: 12/01/2019              Requested by: Johna Roles, PA [address] [place] [address] PCP: Johna Roles, [place]   Assessment & Plan: Visit Diagnoses:  1. Rotator cuff tendinitis, right   2. Anterior subluxation of right shoulder, sequela     Plan: Post fall with right shoulder subluxation relocation bony Bankart lesion.  She is not subluxing.  She is made good progress with therapy.  She is doing her exercises on her own.  I will check her back again in 3 months and today we reviewed the MRI again and discussed the pathology of her shoulder.  Follow-Up Instructions: Return in about 3 months (around 03/02/2020).   Orders:  No orders of the defined types were placed in this encounter.  No orders of the defined types were placed in this encounter.     Procedures: No procedures performed   Clinical Data: No additional findings.   Subjective: Chief Complaint  Patient presents with  . Right Shoulder - Follow-up    Fall 04/20/2019    HPI 54 year old female originally injured her shoulder in April when she tripped just prior to her son's [employer].  She has been doing therapy states her shoulder feels much better she does not have the sharp pain that she had before.  She is using ibuprofen as needed.  Occasionally she feels some discomfort in the axilla she describes her arms feeling heavy but she can fix her hair get dressed and is doing exercises at home.  Patient had a bony Bankart lesion as well as some partial rotator cuff tearing.  Review of Systems 14 point system update unchanged other than as mentioned in HPI.  Objective: Vital Signs: BP 132/77   Pulse 81   Ht 5\' 7"  (1.702 m)   Wt 227 lb (103 kg)   BMI 35.55 kg/m   Physical Exam Constitutional:      Appearance: She is well-developed.  HENT:      Head: Normocephalic.     Right Ear: External ear normal.     Left Ear: External ear normal.  Eyes:     Pupils: Pupils are equal, round, and reactive to light.  Neck:     Thyroid: No thyromegaly.     Trachea: No tracheal deviation.  Cardiovascular:     Rate and Rhythm: Normal rate.  Pulmonary:     Effort: Pulmonary effort is normal.  Abdominal:     Palpations: Abdomen is soft.  Skin:    General: Skin is warm and dry.  Neurological:     Mental Status: She is alert and oriented to person, place, and time.  Psychiatric:        Behavior: Behavior normal.     Ortho Exam long of the biceps is normal right shoulder.  Minimal apprehension external rotation abduction at 90 degrees and external rotation 80 degrees.  Station hand is intact.  Axillary sensation is intact deltoid is active.  Specialty Comments:  No specialty comments available.  Imaging: No results found.   PMFS History: Patient Active Problem List   Diagnosis Date Noted  . Rotator cuff tendinitis, right 10/29/2019  . Anterior subluxation of right shoulder 10/29/2019  . Adenomatous  polyps 10/13/2019  . Pain in right shoulder 04/28/2019   Past Medical History:  Diagnosis Date  . Diabetes mellitus without complication ([place])     Family History  Problem Relation Age of Onset  . Diabetes Mother   . Heart disease Mother   . Hypertension Mother   . Stroke Mother   . Diabetes Father     Past Surgical History:  Procedure Laterality Date  . COSMETIC SURGERY    . FLEXIBLE SIGMOIDOSCOPY N/A 10/13/2019   Procedure: FLEXIBLE SIGMOIDOSCOPY;  Surgeon: Wilford Corner, MD;  [facility]: [facility];  Service: Endoscopy;  Laterality: N/A;   Social History   Occupational History  . Not on file  Tobacco Use  . Smoking status: Never Smoker  . Smokeless tobacco: Never Used  Vaping Use  . Vaping Use: Never used  Substance and Sexual Activity  . Alcohol use: Not on file  . Drug use: Not on file  . Sexual activity: Not  on file

## 2019-12-07 ENCOUNTER — Ambulatory Visit (INDEPENDENT_AMBULATORY_CARE_PROVIDER_SITE_OTHER): Payer: 59 | Admitting: Physical Therapy

## 2019-12-07 ENCOUNTER — Other Ambulatory Visit: Payer: Self-pay

## 2019-12-07 ENCOUNTER — Encounter: Payer: Self-pay | Admitting: Physical Therapy

## 2019-12-07 DIAGNOSIS — M6281 Muscle weakness (generalized): Secondary | ICD-10-CM

## 2019-12-07 DIAGNOSIS — G8929 Other chronic pain: Secondary | ICD-10-CM

## 2019-12-07 DIAGNOSIS — M25511 Pain in right shoulder: Secondary | ICD-10-CM | POA: Diagnosis not present

## 2019-12-07 DIAGNOSIS — M25611 Stiffness of right shoulder, not elsewhere classified: Secondary | ICD-10-CM

## 2019-12-07 NOTE — Therapy (Signed)
[facility] Physical Therapy [address] [place], [place], [address] Phone: [phone]   Fax:  [phone]  Physical Therapy Treatment  Patient Details  Name: Denise Riggs MRN: [id number] Date of Birth: 09-14-1965 Referring Provider (PT): Rodell Perna MD   Encounter Date: 12/07/2019   PT End of Session - 12/07/19 0810    Visit Number 10    Number of Visits 13    Date for PT Re-Evaluation 12/23/19    PT Start Time 0803    PT Stop Time 0841    PT Time Calculation (min) 38 min    Activity Tolerance Patient tolerated treatment well;No increased pain;Patient limited by fatigue    Behavior During Therapy [facility] for tasks assessed/performed           Past Medical History:  Diagnosis Date  . Diabetes mellitus without complication [facility])     Past Surgical History:  Procedure Laterality Date  . COSMETIC SURGERY    . FLEXIBLE SIGMOIDOSCOPY N/A 10/13/2019   Procedure: FLEXIBLE SIGMOIDOSCOPY;  Surgeon: Wilford Corner, MD;  [facility];  Service: Endoscopy;  Laterality: N/A;    There were no vitals filed for this visit.   Subjective Assessment - 12/07/19 0807    Subjective Pt arriving to therapy reporting heaviness and fatigue in her R shoulder. Pt reporting she hasn't seen "stars" lately due to pain levels increasing.    Pertinent History DM    Limitations House hold activities;Lifting    Diagnostic tests X-ray    Patient Stated Goals "be able to get back to normal"    Currently in Pain? Yes    Pain Score 2     Pain Location Shoulder    Pain Orientation Right    Pain Descriptors / Indicators Other (Comment)    Pain Type Chronic pain    Pain Onset More than a month ago              [facility] PT Assessment - 12/07/19 0001      Assessment   Medical Diagnosis M25.511, R shoulder    Referring Provider (PT) Rodell Perna MD    Onset Date/Surgical Date 04/20/19      Observation/Other Assessments   Focus on Therapeutic Outcomes (FOTO)   40% limitation, down from 66% at initial eval, predicted is 37%                         [facility] Adult PT Treatment/Exercise - 12/07/19 0001      Exercises   Exercises Shoulder      Shoulder Exercises: Standing   External Rotation Strengthening;Right;10 reps;Theraband   2 sets slow eccentrics   Theraband Level (Shoulder External Rotation) Level 3 (Green)    Extension AAROM;Strengthening;Both;12 reps;Weights    Extension Weight (lbs) 3# bar    Row Strengthening;Both;20 reps;Theraband   slow eccentrics   Theraband Level (Shoulder Row) Level 4 (Blue)   green today due to popping sensation noted,    Other Standing Exercises placing 5# weight on shoulder height shelf x10, placing 5# weight shoulder height x 10, placing 2# and 5# weight on shelf above shoulder height x 10 each      Shoulder Exercises: ROM/Strengthening   UBE (Upper Arm Bike) 4 minutes forward and back ( 8 minute total), L 1.5, goal of >/= 50 RPM      Shoulder Exercises: Power Statistician rows: 15# 2x10      Shoulder Exercises: Body Blade  Flexion 30 seconds;2 reps    ABduction 30 seconds;2 reps                       PT Long Term Goals - 12/07/19 0813      PT LONG TERM GOAL #1   Title Pt will be able to perform HEP indepdently and progress appropriately.    Status On-going      PT LONG TERM GOAL #2   Title Pt will be able to lift and lower 10 pound object from counter to upper shelf with pain </=2/10.    Baseline Fatigue and mild pain reported lifting 5# weight from counter to shoulder height shelf.    Time 6    Period Weeks    Status On-going      PT LONG TERM GOAL #3   Title Pt will improve her FOTO score to </= 35% limitation.    Status On-going      PT LONG TERM GOAL #4   Title Pt will be able to improve R shoulder mobility to fasten her bra.    Baseline Pt stating she can do the movement but still reporting tighness.    Status On-going      PT LONG TERM  GOAL #5   Title Pt will be able to improve R shoulder strength to >/= 4+/5 to improve functional mobiltiy.    Status On-going                 Plan - 12/07/19 0810    Clinical Impression Statement Pt tolerating treatment well. Reporting decreased in the "popping" sensation. Pt making progress with strengthening exercises.FOTO score is currently 40% limitaiton down from 66% limitation at intial evaluation. Predicted FOTO score is 37% limitation. Contiue skilled PT.    Personal Factors and Comorbidities Comorbidity 1    Comorbidities DM    Examination-Activity Limitations Lift;Reach Overhead;Sleep    Examination-Participation Restrictions Community Activity;Cleaning    Stability/Clinical Decision Making Stable/Uncomplicated    Rehab Potential Good    PT Frequency 2x / week    PT Duration 6 weeks    PT Treatment/Interventions ADLs/Self Care Home Management;Cryotherapy;Electrical Stimulation;Iontophoresis 4mg /ml Dexamethasone;Moist Heat;Ultrasound;Functional mobility training;Therapeutic activities;Therapeutic exercise;Neuromuscular re-education;Patient/family education;Manual techniques;Passive range of motion;Dry needling;Taping;Spinal Manipulations    PT Next Visit Plan Add strengthening to HEP, shoulder ROM, shoulder strengthening, modalities as needed.    PT Home Exercise Plan [email or web address]    Consulted and Agree with Plan of Care Patient           Patient will benefit from skilled therapeutic intervention in order to improve the following deficits and impairments:  Pain, Postural dysfunction, Decreased strength, Impaired UE functional use  Visit Diagnosis: Stiffness of right shoulder, not elsewhere classified  Chronic right shoulder pain  Muscle weakness (generalized)     Problem List Patient Active Problem List   Diagnosis Date Noted  . Rotator cuff tendinitis, right 10/29/2019  . Anterior subluxation of right shoulder 10/29/2019  . Adenomatous polyps 10/13/2019  . Pain  in right shoulder 04/28/2019    Oretha Caprice, PT, MPT 12/07/2019, 8:35 AM  [facility] Physical Therapy [address] [place], [place], [address] Phone: [phone]   Fax:  [phone]  Name: Denise Riggs MRN: [id number] Date of Birth: [birth year]

## 2019-12-09 ENCOUNTER — Ambulatory Visit (INDEPENDENT_AMBULATORY_CARE_PROVIDER_SITE_OTHER): Payer: 59 | Admitting: Physical Therapy

## 2019-12-09 ENCOUNTER — Other Ambulatory Visit: Payer: Self-pay

## 2019-12-09 ENCOUNTER — Encounter: Payer: Self-pay | Admitting: Physical Therapy

## 2019-12-09 DIAGNOSIS — M6281 Muscle weakness (generalized): Secondary | ICD-10-CM | POA: Diagnosis not present

## 2019-12-09 DIAGNOSIS — M25561 Pain in right knee: Secondary | ICD-10-CM

## 2019-12-09 DIAGNOSIS — G8929 Other chronic pain: Secondary | ICD-10-CM

## 2019-12-09 DIAGNOSIS — M25511 Pain in right shoulder: Secondary | ICD-10-CM

## 2019-12-09 DIAGNOSIS — M25611 Stiffness of right shoulder, not elsewhere classified: Secondary | ICD-10-CM

## 2019-12-09 NOTE — Therapy (Addendum)
[facility] [place], [place], [address] Phone: [phone]   Fax:  [phone]  Physical Therapy Treatment Re-certificaiton  Patient Details  Name: Denise Riggs MRN: [id number] Date of Birth: 12-Jan-1965 Referring Provider (PT): Rodell Perna MD   Encounter Date: 12/09/2019   PT End of Session - 12/09/19 0807    Visit Number 11    Number of Visits 19    Date for PT Re-Evaluation 01/29/20    Authorization Type Beginning 12/21/2019, 1x/ week for 6 weeks if approved by Dr. Lorin Mercy    PT Start Time 0800    PT Stop Time 0840    PT Time Calculation (min) 40 min    Activity Tolerance Patient tolerated treatment well;No increased pain;Patient limited by fatigue    Behavior During Therapy [facility] for tasks assessed/performed           Past Medical History:  Diagnosis Date  . Diabetes mellitus without complication [facility])     Past Surgical History:  Procedure Laterality Date  . COSMETIC SURGERY    . FLEXIBLE SIGMOIDOSCOPY N/A 10/13/2019   Procedure: FLEXIBLE SIGMOIDOSCOPY;  Surgeon: Wilford Corner, MD;  Location: [facility];  Service: Endoscopy;  Laterality: N/A;    There were no vitals filed for this visit.   Subjective Assessment - 12/09/19 0804    Subjective Pt arriving reporting fall on Monday in her green house while carrying a poinsettia. Pt stating, "my knees took the brunt of the fall, they are black and blue". Pt reporting fatigue after last session in her R shoulder.    Pertinent History DM    Limitations House hold activities;Lifting    Diagnostic tests X-ray    Patient Stated Goals "be able to get back to normal"    Currently in Pain? Yes    Pain Score 5     Pain Location Shoulder    Pain Orientation Right    Pain Descriptors / Indicators Sore;Tightness;Other (Comment)   pt reporting fatigue in R shoulder   Pain Type Chronic pain    Pain Onset More than a month ago              [facility] PT Assessment -  12/09/19 0001      Assessment   Medical Diagnosis M25.511, R shoulder    Referring Provider (PT) Rodell Perna MD    Onset Date/Surgical Date 04/20/19      Observation/Other Assessments   Focus on Therapeutic Outcomes (FOTO)  40% limitation down from 66% at eval      AROM   Right Shoulder Flexion 164 Degrees    Right Shoulder External Rotation 55 Degrees    Right Shoulder Horizontal ABduction 130 Degrees      Strength   Right Shoulder Flexion 4/5    Right Shoulder Extension 4/5    Right Shoulder ABduction 4/5    Right Shoulder Internal Rotation 4/5    Right Shoulder External Rotation 4/5                         [facility] Adult PT Treatment/Exercise - 12/09/19 0001      Exercises   Exercises Shoulder      Shoulder Exercises: Supine   Flexion AAROM;Both;12 reps;Weights    Shoulder Flexion Weight (lbs) 3           See flowsheets:  Rows x 20 blue theraband Shoulder ER x 20 green theraband Body blade in flexion and abd: x 30 seconds each  2 reps Wall slides with physioball x 15 reps Lifting 5# weight to shoulder height shelf and above shoulder shelf x 10 each Power tower rows x 15# x 20        PT Education - 12/09/19 0823    Education Details discussed goal progress and recommendation to extend therapy 1x/ week for 6 more weeks.    Person(s) Educated Patient    Methods Explanation    Comprehension Verbalized understanding               PT Long Term Goals - 12/09/19 0817      PT LONG TERM GOAL #1   Title Pt will be able to perform HEP indepdently and progress appropriately.    Status On-going      PT LONG TERM GOAL #2   Title Pt will be able to lift and lower 10 pound object from counter to upper shelf with pain </=2/10.    Baseline Fatigue and mild pain reported lifting 5# weight from counter to shoulder height shelf.    Status On-going      PT LONG TERM GOAL #3   Title Pt will improve her FOTO score to </= 35% limitation.    Baseline 40%  limitation    Status On-going      PT LONG TERM GOAL #4   Title Pt will be able to improve R shoulder mobility to fasten her bra.    Baseline Pt stating she can do the movement but still reporting tighness.    Status On-going      PT LONG TERM GOAL #5   Title Pt will be able to improve R shoulder strength to >/= 4+/5 to improve functional mobiltiy.    Baseline grossly 4/5    Status On-going                 Plan - 12/09/19 0818    Clinical Impression Statement Pt arriving to therapy still reporting weakness and fatigue. Pt demonstrating diffiulty with lifting and overhead reaching. Reporting 510 pain today due to recent fall on Monday in her green house when she tripped.  Pt with only 2 visits left for next week. Pt is still progressing toward all her LTG"s none met presently, but pt has made excellent progress over the last 2 weeks. I am requesting 6 additional visits beginning on 12/21/2019 for 1x/ week for 6 weeks to progress toward improved strength and functional mobility.    Personal Factors and Comorbidities Comorbidity 1    Comorbidities DM    Examination-Activity Limitations Lift;Reach Overhead;Sleep    Examination-Participation Restrictions Community Activity;Cleaning    Stability/Clinical Decision Making Stable/Uncomplicated    Rehab Potential Good    PT Frequency --   2x/ week for one more week, 1x/ week for 6 weeks   PT Duration 6 weeks    PT Treatment/Interventions ADLs/Self Care Home Management;Cryotherapy;Electrical Stimulation;Iontophoresis 76m/ml Dexamethasone;Moist Heat;Ultrasound;Functional mobility training;Therapeutic activities;Therapeutic exercise;Neuromuscular re-education;Patient/family education;Manual techniques;Passive range of motion;Dry needling;Taping;Spinal Manipulations    PT Next Visit Plan Add strengthening to HEP, shoulder ROM, shoulder strengthening, modalities as needed.    PT Home Exercise Plan [email or web address]    Consulted and Agree with Plan of Care  Patient           Patient will benefit from skilled therapeutic intervention in order to improve the following deficits and impairments:  Pain, Postural dysfunction, Decreased strength, Impaired UE functional use  Visit Diagnosis: Chronic right shoulder pain  Stiffness of right shoulder, not elsewhere classified  Muscle weakness (generalized)  Chronic pain of right knee  Acute pain of right shoulder     Problem List Patient Active Problem List   Diagnosis Date Noted  . Rotator cuff tendinitis, right 10/29/2019  . Anterior subluxation of right shoulder 10/29/2019  . Adenomatous polyps 10/13/2019  . Pain in right shoulder 04/28/2019    Oretha Caprice, PT , MPT 12/09/2019, 11:41 AM  [facility] [address] [place], [place], [address] Phone: [phone]   Fax:  [phone]  Name: Denise Riggs MRN: [id number] Date of Birth: Sep 13, 1965

## 2019-12-15 ENCOUNTER — Encounter: Payer: Self-pay | Admitting: Physical Therapy

## 2019-12-15 ENCOUNTER — Ambulatory Visit (INDEPENDENT_AMBULATORY_CARE_PROVIDER_SITE_OTHER): Payer: 59 | Admitting: Physical Therapy

## 2019-12-15 ENCOUNTER — Other Ambulatory Visit: Payer: Self-pay

## 2019-12-15 DIAGNOSIS — G8929 Other chronic pain: Secondary | ICD-10-CM

## 2019-12-15 DIAGNOSIS — M25561 Pain in right knee: Secondary | ICD-10-CM | POA: Diagnosis not present

## 2019-12-15 DIAGNOSIS — M25511 Pain in right shoulder: Secondary | ICD-10-CM | POA: Diagnosis not present

## 2019-12-15 DIAGNOSIS — M6281 Muscle weakness (generalized): Secondary | ICD-10-CM

## 2019-12-15 DIAGNOSIS — M25611 Stiffness of right shoulder, not elsewhere classified: Secondary | ICD-10-CM

## 2019-12-15 NOTE — Therapy (Signed)
[place], [place], [address] Phone: [phone]   Fax:  [phone]  Physical Therapy Treatment  Patient Details  Name: Denise Riggs MRN: [id number] Date of Birth: 18-Aug-1965 Referring Provider (PT): Rodell Perna MD   Encounter Date: 12/15/2019   PT End of Session - 12/15/19 0837    Visit Number 12    Number of Visits 19    Date for PT Re-Evaluation 01/29/20    Authorization Type Beginning 12/21/2019, 1x/ week for 6 weeks if approved by Dr. Lorin Mercy    PT Start Time 0800    PT Stop Time 0842    PT Time Calculation (min) 42 min    Activity Tolerance Patient tolerated treatment well;No increased pain;Patient limited by fatigue    Behavior During Therapy [facility] for tasks assessed/performed           Past Medical History:  Diagnosis Date  . Diabetes mellitus without complication [facility])     Past Surgical History:  Procedure Laterality Date  . COSMETIC SURGERY    . FLEXIBLE SIGMOIDOSCOPY N/A 10/13/2019   Procedure: FLEXIBLE SIGMOIDOSCOPY;  Surgeon: Wilford Corner, MD;  Location: [facility];  Service: Endoscopy;  Laterality: N/A;    There were no vitals filed for this visit.   Subjective Assessment - 12/15/19 0834    Subjective Pt arriving to therapy reporting fatigue over the weekend due to doing more around her green house. Pt reporting her Dad had to have knee surgery and she has been helping him out more.    Pertinent History DM    Limitations House hold activities;Lifting    Diagnostic tests X-ray    Patient Stated Goals "be able to get back to normal"    Currently in Pain? No/denies                             [facility] Adult PT Treatment/Exercise - 12/15/19 0001      Exercises   Exercises Shoulder      Shoulder Exercises: Standing   External Rotation Strengthening;20 reps;Theraband    Theraband Level (Shoulder External Rotation) Level 3 (Green)    Extension Strengthening;20 reps     Extension Weight (lbs) 3# bar    Row Strengthening;20 reps;Theraband    Theraband Level (Shoulder Row) Level 4 (Blue)    Other Standing Exercises placing 6# weight on shoulder height shelf and over head shelf 10 x each   rest breaks required today due to increasing weight     Shoulder Exercises: ROM/Strengthening   UBE (Upper Arm Bike) 4 minutes forward/back L2    Wall Pushups 10 reps;Limitations      Shoulder Exercises: Stretch   Other Shoulder Stretches Door stretch x 2 holding 30 seconds, ER elbows at 90 degrees      Shoulder Exercises: Power Statistician Rows: 15# 2 sets 20 reps    Other Power Tower Exercises wood chop x 20 5 #    Other Power Manufacturing engineer pulls x 10 5#      Shoulder Exercises: Body Blade   Flexion 30 seconds;3 reps    ABduction 30 seconds;3 reps    External Rotation 30 seconds;2 reps                  PT Education - 12/15/19 0837    Education Details Discussed continue progression with ER of R shoulder and encouraged more home stretching  Person(s) Educated Patient    Methods Explanation;Demonstration    Comprehension Verbalized understanding               PT Long Term Goals - 12/09/19 0817      PT LONG TERM GOAL #1   Title Pt will be able to perform HEP indepdently and progress appropriately.    Status On-going      PT LONG TERM GOAL #2   Title Pt will be able to lift and lower 10 pound object from counter to upper shelf with pain </=2/10.    Baseline Fatigue and mild pain reported lifting 5# weight from counter to shoulder height shelf.    Status On-going      PT LONG TERM GOAL #3   Title Pt will improve her FOTO score to </= 35% limitation.    Baseline 40% limitation    Status On-going      PT LONG TERM GOAL #4   Title Pt will be able to improve R shoulder mobility to fasten her bra.    Baseline Pt stating she can do the movement but still reporting tighness.    Status On-going      PT LONG  TERM GOAL #5   Title Pt will be able to improve R shoulder strength to >/= 4+/5 to improve functional mobiltiy.    Baseline grossly 4/5    Status On-going                 Plan - 12/15/19 0840    Clinical Impression Statement Pt tolerating exercises today reporting no pain upon arrival. Pt did report mild increase in pain during session to 2-3/10 while progressing strengtheing. Encouraged more ER stretchign at home. Continue skilled PT towrad LTG's.    Personal Factors and Comorbidities Comorbidity 1    Comorbidities DM    Examination-Activity Limitations Lift;Reach Overhead;Sleep    Examination-Participation Restrictions Community Activity;Cleaning    Stability/Clinical Decision Making Stable/Uncomplicated    Rehab Potential Good    PT Duration 6 weeks    PT Treatment/Interventions ADLs/Self Care Home Management;Cryotherapy;Electrical Stimulation;Iontophoresis 4mg /ml Dexamethasone;Moist Heat;Ultrasound;Functional mobility training;Therapeutic activities;Therapeutic exercise;Neuromuscular re-education;Patient/family education;Manual techniques;Passive range of motion;Dry needling;Taping;Spinal Manipulations    PT Next Visit Plan Add strengthening to HEP, shoulder ROM, shoulder strengthening, modalities as needed.    PT Home Exercise Plan [email or web address]    Consulted and Agree with Plan of Care Patient           Patient will benefit from skilled therapeutic intervention in order to improve the following deficits and impairments:  Pain, Postural dysfunction, Decreased strength, Impaired UE functional use  Visit Diagnosis: Chronic right shoulder pain  Stiffness of right shoulder, not elsewhere classified  Muscle weakness (generalized)  Chronic pain of right knee  Acute pain of right shoulder     Problem List Patient Active Problem List   Diagnosis Date Noted  . Rotator cuff tendinitis, right 10/29/2019  . Anterior subluxation of right shoulder 10/29/2019  . Adenomatous  polyps 10/13/2019  . Pain in right shoulder 04/28/2019    Oretha Caprice, PT, MPT 12/15/2019, 8:56 AM  [facility] [address] [place], [place], [address] Phone: [phone]   Fax:  [phone]  Name: Kyomi Hector MRN: [id number] Date of Birth: 1965-08-16

## 2019-12-17 ENCOUNTER — Ambulatory Visit (INDEPENDENT_AMBULATORY_CARE_PROVIDER_SITE_OTHER): Payer: 59 | Admitting: Rehabilitative and Restorative Service Providers"

## 2019-12-17 ENCOUNTER — Encounter: Payer: Self-pay | Admitting: Rehabilitative and Restorative Service Providers"

## 2019-12-17 ENCOUNTER — Other Ambulatory Visit: Payer: Self-pay

## 2019-12-17 DIAGNOSIS — M25611 Stiffness of right shoulder, not elsewhere classified: Secondary | ICD-10-CM | POA: Diagnosis not present

## 2019-12-17 DIAGNOSIS — M25511 Pain in right shoulder: Secondary | ICD-10-CM

## 2019-12-17 DIAGNOSIS — G8929 Other chronic pain: Secondary | ICD-10-CM | POA: Diagnosis not present

## 2019-12-17 DIAGNOSIS — M6281 Muscle weakness (generalized): Secondary | ICD-10-CM

## 2019-12-17 NOTE — Therapy (Signed)
[place], [place], [address] Phone: [phone]   Fax:  [phone]  Physical Therapy Treatment  Patient Details  Name: Denise Riggs MRN: [id number] Date of Birth: [birth year] Referring Provider (PT): Rodell Perna MD   Encounter Date: 12/17/2019   PT End of Session - 12/17/19 1343    Visit Number 13    Number of Visits 19    Date for PT Re-Evaluation 01/29/20    Authorization Type Beginning 12/21/2019, 1x/ week for 6 weeks if approved by Dr. Lorin Mercy    PT Start Time [phone]    PT Stop Time 0846    PT Time Calculation (min) 39 min    Activity Tolerance Patient tolerated treatment well;No increased pain;Patient limited by fatigue    Behavior During Therapy [facility] for tasks assessed/performed           Past Medical History:  Diagnosis Date  . Diabetes mellitus without complication [facility])     Past Surgical History:  Procedure Laterality Date  . COSMETIC SURGERY    . FLEXIBLE SIGMOIDOSCOPY N/A 10/13/2019   Procedure: FLEXIBLE SIGMOIDOSCOPY;  Surgeon: Wilford Corner, MD;  Location: [facility];  Service: Endoscopy;  Laterality: N/A;    There were no vitals filed for this visit.   Subjective Assessment - 12/17/19 1340    Subjective Denise Riggs notes significant progress since starting PT.  It does take her "a day or 2" to recover from her soreness post-PT.    Pertinent History DM    Limitations House hold activities;Lifting    Diagnostic tests X-ray    Patient Stated Goals "be able to get back to normal"    Currently in Pain? Yes    Pain Score 3     Pain Location Shoulder    Pain Orientation Right    Pain Descriptors / Indicators Aching;Sore;Tightness    Pain Type Chronic pain    Pain Onset More than a month ago    Pain Frequency Intermittent    Aggravating Factors  End range IR and ER.  Reaching and overhead lifting.    Pain Relieving Factors Ibuprofen PRN    Effect of Pain on Daily Activities Sleep is better as  is shoulder use at work.  Strength still limited and affects work and function.                             [place] Adult PT Treatment/Exercise - 12/17/19 0001      Exercises   Exercises Shoulder      Shoulder Exercises: Supine   Protraction Strengthening;Right;20 reps   3 seconds   Protraction Weight (lbs) 5#    External Rotation AROM;Right;10 reps   10 seconds   Internal Rotation AROM;Right;10 reps   10 seconds     Shoulder Exercises: Prone   Other Prone Exercises Prone 90 and 100 degrees thumb up (full ER) 10X each 3 seconds      Shoulder Exercises: Standing   External Rotation Strengthening;Right;10 reps;Theraband   2 sets slow eccentrics   Theraband Level (Shoulder External Rotation) Level 2 (Red)   2 sets   Row Strengthening;Both;20 reps;Theraband   slow eccentrics   Theraband Level (Shoulder Row) Level 4 (Blue)    Retraction Strengthening;Both;10 reps   Shoulder blade pinches  5 seconds     Shoulder Exercises: ROM/Strengthening   UBE (Upper Arm Bike) 5 minutes (:30 forward/:30 back/:30 rest) 60-70 RPM level 5  Shoulder Exercises: Body Blade   Flexion 3 reps;30 seconds    ABduction 1 rep;30 seconds    External Rotation 3 reps;30 seconds                  PT Education - 12/17/19 1342    Education Details Reviewed program with addition of RTC strengthening.    Person(s) Educated Patient    Methods Explanation;Demonstration;Verbal cues;Handout    Comprehension Verbal cues required;Returned demonstration;Need further instruction;Verbalized understanding               PT Long Term Goals - 12/17/19 1342      PT LONG TERM GOAL #1   Title Pt will be able to perform HEP indepdently and progress appropriately.    Status On-going      PT LONG TERM GOAL #2   Title Pt will be able to lift and lower 10 pound object from counter to upper shelf with pain </=2/10.    Baseline Fatigue and mild pain reported lifting 5# weight from counter to  shoulder height shelf.    Status On-going      PT LONG TERM GOAL #3   Title Pt will improve her FOTO score to </= 35% limitation.    Baseline 40% limitation    Status On-going      PT LONG TERM GOAL #4   Title Pt will be able to improve R shoulder mobility to fasten her bra.    Baseline Pt stating she can do the movement but still reporting tighness.    Status On-going      PT LONG TERM GOAL #5   Title Pt will be able to improve R shoulder strength to >/= 4+/5 to improve functional mobiltiy.    Baseline grossly 4/5    Status On-going                 Plan - 12/17/19 1343    Clinical Impression Statement Denise Riggs notes significant functional progress since starting PT.  Sleep is better and she notes better AROM.  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    Personal Factors and Comorbidities Comorbidity 1    Comorbidities DM    Examination-Activity Limitations Lift;Reach Overhead;Sleep    Examination-Participation Restrictions Community Activity;Cleaning    Stability/Clinical Decision Making Stable/Uncomplicated    Rehab Potential Good    PT Duration 6 weeks    PT Treatment/Interventions ADLs/Self Care Home Management;Cryotherapy;Electrical Stimulation;Iontophoresis 4mg /ml Dexamethasone;Moist Heat;Ultrasound;Functional mobility training;Therapeutic activities;Therapeutic exercise;Neuromuscular re-education;Patient/family education;Manual techniques;Passive range of motion;Dry needling;Taping;Spinal Manipulations    PT Next Visit Plan Add strengthening to HEP, shoulder ROM, shoulder strengthening, modalities as needed.    PT Home Exercise Plan [email or web address]    Consulted and Agree with Plan of Care Patient           Patient will benefit from skilled therapeutic intervention in order to improve the following deficits and impairments:  Pain,Postural  dysfunction,Decreased strength,Impaired UE functional use  Visit Diagnosis: Chronic right shoulder pain  Stiffness of right shoulder, not elsewhere classified  Muscle weakness (generalized)     Problem List Patient Active Problem List   Diagnosis Date Noted  . Rotator cuff tendinitis, right 10/29/2019  . Anterior subluxation of right shoulder 10/29/2019  . Adenomatous polyps 10/13/2019  . Pain in right shoulder 04/28/2019    Farley Ly PT, MPT 12/17/2019, 1:45 PM  [facility] [address] [place], [place], [address] Phone: [phone]   Fax:  [phone]  Name: Denise Riggs MRN: [id number] Date of Birth: [birth year]

## 2019-12-24 ENCOUNTER — Encounter: Payer: Self-pay | Admitting: Rehabilitative and Restorative Service Providers"

## 2019-12-24 ENCOUNTER — Ambulatory Visit (INDEPENDENT_AMBULATORY_CARE_PROVIDER_SITE_OTHER): Payer: 59 | Admitting: Rehabilitative and Restorative Service Providers"

## 2019-12-24 ENCOUNTER — Other Ambulatory Visit: Payer: Self-pay

## 2019-12-24 DIAGNOSIS — G8929 Other chronic pain: Secondary | ICD-10-CM

## 2019-12-24 DIAGNOSIS — M25611 Stiffness of right shoulder, not elsewhere classified: Secondary | ICD-10-CM | POA: Diagnosis not present

## 2019-12-24 DIAGNOSIS — M25511 Pain in right shoulder: Secondary | ICD-10-CM

## 2019-12-24 DIAGNOSIS — M6281 Muscle weakness (generalized): Secondary | ICD-10-CM | POA: Diagnosis not present

## 2019-12-24 NOTE — Therapy (Signed)
[place], [place], [address] Phone: [phone]   Fax:  [phone]  Physical Therapy Treatment  Patient Details  Name: Denise Riggs MRN: [id number] Date of Birth: 11-24-1965 Referring Provider (PT): Rodell Perna MD   Encounter Date: 12/24/2019   PT End of Session - 12/24/19 1225    Visit Number 14    Number of Visits 19    Date for PT Re-Evaluation 01/29/20    Authorization Type Beginning 12/21/2019, 1x/ week for 6 weeks if approved by Dr. Lorin Mercy    PT Start Time 1147    PT Stop Time 1228    PT Time Calculation (min) 41 min    Activity Tolerance Patient tolerated treatment well;No increased pain;Patient limited by fatigue    Behavior During Therapy [facility] for tasks assessed/performed           Past Medical History:  Diagnosis Date  . Diabetes mellitus without complication [facility])     Past Surgical History:  Procedure Laterality Date  . COSMETIC SURGERY    . FLEXIBLE SIGMOIDOSCOPY N/A 10/13/2019   Procedure: FLEXIBLE SIGMOIDOSCOPY;  Surgeon: Wilford Corner, MD;  Location: [facility];  Service: Endoscopy;  Laterality: N/A;    There were no vitals filed for this visit.   Subjective Assessment - 12/24/19 1222    Subjective Denise Riggs notes her shoulder no longer bothers her at night.  Watering plants is easier.  Reaching to hang up heavier items (family's dry cleaners) is difficult.    Pertinent History DM    Limitations House hold activities;Lifting    Diagnostic tests X-ray    Patient Stated Goals "be able to get back to normal"    Currently in Pain? No/denies    Pain Onset More than a month ago                             [facility] Adult PT Treatment/Exercise - 12/24/19 0001      Exercises   Exercises Shoulder      Shoulder Exercises: Supine   Protraction Strengthening;Right;20 reps   3 seconds   Protraction Weight (lbs) 5#, 6#    External Rotation AROM;Right;10 reps   10 seconds    Internal Rotation AROM;Right;10 reps   10 seconds     Shoulder Exercises: Prone   Other Prone Exercises Prone 90 and 100 degrees thumb up (full ER) 10X each 3 seconds      Shoulder Exercises: Standing   External Rotation Strengthening;Right;10 reps;Theraband   2 sets slow eccentrics   Theraband Level (Shoulder External Rotation) Level 2 (Red)   2 sets   Row Strengthening;Both;20 reps;Theraband   slow eccentrics   Theraband Level (Shoulder Row) Level 4 (Blue)    Retraction Strengthening;Both;10 reps   Shoulder blade pinches  5 seconds     Shoulder Exercises: ROM/Strengthening   UBE (Upper Arm Bike) 5 minutes (:30 forward/:30 back/:30 rest) 60-70 RPM level 5                  PT Education - 12/24/19 1223    Education Details Reviewed HEP with emphasis on daily capsular stretching and 3X/week strengthening.    Person(s) Educated Patient    Methods Explanation;Demonstration;Tactile cues;Verbal cues    Comprehension Verbal cues required;Need further instruction;Returned demonstration;Verbalized understanding;Tactile cues required               PT Long Term Goals - 12/24/19 1224  PT LONG TERM GOAL #1   Title Pt will be able to perform HEP indepdently and progress appropriately.    Status Achieved      PT LONG TERM GOAL #2   Title Pt will be able to lift and lower 10 pound object from counter to upper shelf with pain </=2/10.    Baseline Fatigue and mild pain reported lifting 5# weight from counter to shoulder height shelf.    Status On-going      PT LONG TERM GOAL #3   Title Pt will improve her FOTO score to </= 35% limitation.    Baseline 40% limitation    Status On-going      PT LONG TERM GOAL #4   Title Pt will be able to improve R shoulder mobility to fasten her bra.    Baseline Pt stating she can do the movement but still reporting tighness.    Status Achieved      PT LONG TERM GOAL #5   Title Pt will be able to improve R shoulder strength to >/= 4+/5 to  improve functional mobiltiy.    Baseline grossly 4/5    Status On-going                 Plan - 12/24/19 1336    Clinical Impression Statement Denise Riggs is making excellent progress with her supervised physical therapy and her comprehensive HEP.  Sleep is no longer disturbed by shoulder pain.  She notes being able to do many ADLs with no to mild difficulty.  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    Personal Factors and Comorbidities Comorbidity 1    Comorbidities DM    Examination-Activity Limitations Lift;Reach Overhead;Sleep    Examination-Participation Restrictions Community Activity;Cleaning    Stability/Clinical Decision Making Stable/Uncomplicated    Rehab Potential Good    PT Duration 6 weeks    PT Treatment/Interventions ADLs/Self Care Home Management;Cryotherapy;Electrical Stimulation;Iontophoresis 4mg /ml Dexamethasone;Moist Heat;Ultrasound;Functional mobility training;Therapeutic activities;Therapeutic exercise;Neuromuscular re-education;Patient/family education;Manual techniques;Passive range of motion;Dry needling;Taping;Spinal Manipulations    PT Next Visit Plan Add strengthening to HEP, shoulder ROM, shoulder strengthening, modalities as needed.    PT Home Exercise Plan [email or web address]    Consulted and Agree with Plan of Care Patient           Patient will benefit from skilled therapeutic intervention in order to improve the following deficits and impairments:  Pain,Postural dysfunction,Decreased strength,Impaired UE functional use  Visit Diagnosis: Muscle weakness (generalized)  Stiffness of right shoulder, not elsewhere classified  Chronic right shoulder pain     Problem List Patient Active Problem List   Diagnosis Date Noted  . Rotator cuff tendinitis, right 10/29/2019  . Anterior subluxation of right shoulder 10/29/2019  . Adenomatous polyps 10/13/2019  . Pain in right shoulder 04/28/2019    Farley Ly PT,  MPT 12/24/2019, 1:39 PM  [facility] [address] [place], [place], [address] Phone: [phone]   Fax:  [phone]  Name: Denise Riggs MRN: [id number] Date of Birth: 1965/03/04

## 2019-12-31 ENCOUNTER — Ambulatory Visit (INDEPENDENT_AMBULATORY_CARE_PROVIDER_SITE_OTHER): Payer: 59 | Admitting: Rehabilitative and Restorative Service Providers"

## 2019-12-31 ENCOUNTER — Other Ambulatory Visit: Payer: Self-pay

## 2019-12-31 ENCOUNTER — Encounter: Payer: Self-pay | Admitting: Rehabilitative and Restorative Service Providers"

## 2019-12-31 DIAGNOSIS — M25611 Stiffness of right shoulder, not elsewhere classified: Secondary | ICD-10-CM | POA: Diagnosis not present

## 2019-12-31 DIAGNOSIS — R293 Abnormal posture: Secondary | ICD-10-CM | POA: Diagnosis not present

## 2019-12-31 DIAGNOSIS — M25511 Pain in right shoulder: Secondary | ICD-10-CM | POA: Diagnosis not present

## 2019-12-31 DIAGNOSIS — G8929 Other chronic pain: Secondary | ICD-10-CM

## 2019-12-31 DIAGNOSIS — M6281 Muscle weakness (generalized): Secondary | ICD-10-CM

## 2019-12-31 NOTE — Therapy (Signed)
[place], [place], [address] Phone: [phone]   Fax:  [phone]  Physical Therapy Treatment  Patient Details  Name: Denise Riggs MRN: [id number] Date of Birth: Jun 25, 1965 Referring Provider (PT): Rodell Perna MD   Encounter Date: 12/31/2019   PT End of Session - 12/31/19 1612    Visit Number 15    Number of Visits 19    Date for PT Re-Evaluation 01/29/20    Authorization Type Beginning 12/21/2019, 1x/ week for 6 weeks if approved by Dr. Lorin Mercy    PT Start Time [id number]    PT Stop Time 1600    PT Time Calculation (min) 45 min    Activity Tolerance Patient tolerated treatment well;No increased pain;Patient limited by fatigue    Behavior During Therapy [facility] for tasks assessed/performed           Past Medical History:  Diagnosis Date  . Diabetes mellitus without complication [facility])     Past Surgical History:  Procedure Laterality Date  . COSMETIC SURGERY    . FLEXIBLE SIGMOIDOSCOPY N/A 10/13/2019   Procedure: FLEXIBLE SIGMOIDOSCOPY;  Surgeon: Wilford Corner, MD;  Location: [facility];  Service: Endoscopy;  Laterality: N/A;    There were no vitals filed for this visit.   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  Sleep is still undisturbed.    Pertinent History DM    Limitations House hold activities;Lifting    Diagnostic tests X-ray    Patient Stated Goals "be able to get back to normal"    Currently in Pain? Yes    Pain Score 4     Pain Location Shoulder    Pain Orientation Right    Pain Descriptors / Indicators Aching;Sore    Pain Type Chronic pain    Pain Onset More than a month ago    Pain Frequency Intermittent    Aggravating Factors  Overuse    Pain Relieving Factors Ibuprofen and rest    Effect of Pain on Daily Activities Pain with overuse at work and home                              [facility] Adult PT Treatment/Exercise - 12/31/19 0001      Exercises   Exercises Shoulder      Shoulder Exercises: Supine   Protraction Strengthening;20 reps;Both   3 seconds   Protraction Weight (lbs) 6#    External Rotation AROM;Right;10 reps   10 seconds   Internal Rotation AROM;Right;10 reps   10 seconds     Shoulder Exercises: Prone   Other Prone Exercises Prone 90 and 100 degrees thumb up (full ER) 10X each 3 seconds      Shoulder Exercises: Standing   External Rotation Strengthening;Right;10 reps;Theraband   2 sets slow eccentrics   Theraband Level (Shoulder External Rotation) Level 2 (Red)   2 sets   Row Strengthening;Both;20 reps;Theraband   slow eccentrics   Theraband Level (Shoulder Row) Level 4 (Blue)    Retraction Strengthening;Both;10 reps   Shoulder blade pinches  5 seconds     Shoulder Exercises: ROM/Strengthening   UBE (Upper Arm Bike) 5 minutes (:30 forward/:30 back/:30 rest) 60-70 RPM level 5                  PT Education - 12/31/19 1611    Education  Details Reviewed and updated HEP    Person(s) Educated Patient    Methods Explanation;Demonstration;Tactile cues;Verbal cues;Handout    Comprehension Verbal cues required;Need further instruction;Returned demonstration;Verbalized understanding;Tactile cues required               PT Long Term Goals - 12/31/19 1611      PT LONG TERM GOAL #1   Title Pt will be able to perform HEP indepdently and progress appropriately.    Status Achieved      PT LONG TERM GOAL #2   Title Pt will be able to lift and lower 10 pound object from counter to upper shelf with pain </=2/10.    Baseline Fatigue and mild pain reported lifting 5# weight from counter to shoulder height shelf.    Status Achieved      PT LONG TERM GOAL #3   Title Pt will improve her FOTO score to </= 35% limitation.    Baseline 40% limitation    Status On-going      PT LONG TERM GOAL #4   Title Pt will  be able to improve R shoulder mobility to fasten her bra.    Baseline Pt stating she can do the movement but still reporting tighness.    Status Achieved      PT LONG TERM GOAL #5   Title Pt will be able to improve R shoulder strength to >/= 4+/5 to improve functional mobiltiy.    Baseline grossly 4/5    Status On-going                 Plan - 12/31/19 1613    Clinical Impression Statement Denise Riggs continues to make steady progress towards long-term goals.  As would be expected with a RTC tear, increased activity results in increased pain and soreness.  Sleep is no longer affected.  FOTO was 83 today (34 at evaluation).  Denise Riggs had her HEP updated today and will attend a final follow-up next week to determine if she is ready for independent rehabilitation.    Personal Factors and Comorbidities Comorbidity 1    Comorbidities DM    Examination-Activity Limitations Lift;Reach Overhead;Sleep    Examination-Participation Restrictions Community Activity;Cleaning    Stability/Clinical Decision Making Stable/Uncomplicated    Rehab Potential Good    PT Duration 6 weeks    PT Treatment/Interventions ADLs/Self Care Home Management;Cryotherapy;Electrical Stimulation;Iontophoresis 4mg /ml Dexamethasone;Moist Heat;Ultrasound;Functional mobility training;Therapeutic activities;Therapeutic exercise;Neuromuscular re-education;Patient/family education;Manual techniques;Passive range of motion;Dry needling;Taping;Spinal Manipulations    PT Next Visit Plan DC?    PT Home Exercise Plan [email or web address]    Consulted and Agree with Plan of Care Patient           Patient will benefit from skilled therapeutic intervention in order to improve the following deficits and impairments:  Pain,Postural dysfunction,Decreased strength,Impaired UE functional use  Visit Diagnosis: Abnormal posture  Stiffness of right shoulder, not elsewhere classified  Chronic right shoulder pain  Muscle weakness  (generalized)     Problem List Patient Active Problem List   Diagnosis Date Noted  . Rotator cuff tendinitis, right 10/29/2019  . Anterior subluxation of right shoulder 10/29/2019  . Adenomatous polyps 10/13/2019  . Pain in right shoulder 04/28/2019    Farley Ly PT, MPT 12/31/2019, 4:16 PM  [facility] [address] [place], [place], [address] Phone: [phone]   Fax:  [phone]  Name: Janera Peugh MRN: [id number] Date of Birth: Jul 31, 1965

## 2020-01-04 ENCOUNTER — Other Ambulatory Visit: Payer: Self-pay

## 2020-01-04 ENCOUNTER — Encounter: Payer: Self-pay | Admitting: Rehabilitative and Restorative Service Providers"

## 2020-01-04 ENCOUNTER — Ambulatory Visit (INDEPENDENT_AMBULATORY_CARE_PROVIDER_SITE_OTHER): Payer: 59 | Admitting: Rehabilitative and Restorative Service Providers"

## 2020-01-04 DIAGNOSIS — R293 Abnormal posture: Secondary | ICD-10-CM

## 2020-01-04 DIAGNOSIS — M25511 Pain in right shoulder: Secondary | ICD-10-CM | POA: Diagnosis not present

## 2020-01-04 DIAGNOSIS — M6281 Muscle weakness (generalized): Secondary | ICD-10-CM | POA: Diagnosis not present

## 2020-01-04 DIAGNOSIS — M25611 Stiffness of right shoulder, not elsewhere classified: Secondary | ICD-10-CM | POA: Diagnosis not present

## 2020-01-04 DIAGNOSIS — G8929 Other chronic pain: Secondary | ICD-10-CM

## 2020-01-04 NOTE — Therapy (Signed)
[facility] [address] [place], [place], [address] Phone: [phone]   Fax:  [phone]  Physical Therapy Treatment  Patient Details  Name: Denise Riggs MRN: [id number] Date of Birth: 1965-01-11 Referring Provider (PT): Annell Greening MD   Encounter Date: 01/04/2020   PT End of Session - 01/04/20 0903    Visit Number 16    Number of Visits 19    Date for PT Re-Evaluation 01/29/20    Authorization Type Beginning 12/21/2019, 1x/ week for 6 weeks if approved by Dr. Ophelia Charter    PT Start Time [id number]    PT Stop Time 0904    PT Time Calculation (min) 43 min    Activity Tolerance Patient tolerated treatment well;No increased pain;Patient limited by fatigue    Behavior During Therapy [facility] for tasks assessed/performed           Past Medical History:  Diagnosis Date  . Diabetes mellitus without complication [facility])     Past Surgical History:  Procedure Laterality Date  . COSMETIC SURGERY    . FLEXIBLE SIGMOIDOSCOPY N/A 10/13/2019   Procedure: FLEXIBLE SIGMOIDOSCOPY;  Surgeon: Charlott Rakes, MD;  Location: [facility];  Service: Endoscopy;  Laterality: N/A;    There were no vitals filed for this visit.   Subjective Assessment - 01/04/20 0828    Subjective Denise Riggs notes difficulty sleeping Thursday night.  Friday was better and Saturday back to normal.    Pertinent History DM    Limitations House hold activities;Lifting    Diagnostic tests X-ray    Patient Stated Goals "be able to get back to normal"    Currently in Pain? No/denies    Pain Onset More than a month ago    Aggravating Factors  Overuse    Pain Relieving Factors Ibuprofen and rest    Effect of Pain on Daily Activities Limits endurance with reaching and overhead function                             [facility] Adult PT Treatment/Exercise - 01/04/20 0001      Exercises   Exercises Shoulder      Shoulder Exercises: Supine   Protraction Strengthening;20  reps;Both   3 seconds   Protraction Weight (lbs) 6#    External Rotation AROM;Right;10 reps   10 seconds   Internal Rotation AROM;Right;10 reps   10 seconds     Shoulder Exercises: Prone   Other Prone Exercises Prone 90 and 100 degrees thumb up (full ER) 10X each 3 seconds      Shoulder Exercises: Standing   External Rotation Strengthening;Right;10 reps;Theraband   2 sets slow eccentrics   Theraband Level (Shoulder External Rotation) Level 2 (Red)   2 sets   Row Strengthening;Both;20 reps;Theraband   slow eccentrics   Theraband Level (Shoulder Row) Level 4 (Blue)    Retraction Strengthening;Both;10 reps   Shoulder blade pinches  5 seconds     Shoulder Exercises: ROM/Strengthening   UBE (Upper Arm Bike) 5 minutes (:30 forward/:30 back/:30 rest) 60-70 RPM level 5                  PT Education - 01/04/20 2637    Education Details Reviewed HEP with corrections PRN.    Person(s) Educated Patient    Methods Verbal cues    Comprehension Verbalized understanding;Returned demonstration;Verbal cues required               PT Long Term Goals -  01/04/20 0900      PT LONG TERM GOAL #1   Title Pt will be able to perform HEP indepdently and progress appropriately.    Status Achieved      PT LONG TERM GOAL #2   Title Pt will be able to lift and lower 10 pound object from counter to upper shelf with pain </=2/10.    Baseline Fatigue and mild pain reported lifting 5# weight from counter to shoulder height shelf.    Status Achieved      PT LONG TERM GOAL #3   Title Pt will improve her FOTO score to </= 35% limitation.    Baseline 40% limitation    Status Achieved      PT LONG TERM GOAL #4   Title Pt will be able to improve R shoulder mobility to fasten her bra.    Baseline Pt stating she can do the movement but still reporting tighness.    Status Achieved      PT LONG TERM GOAL #5   Title Pt will be able to improve R shoulder strength to >/= 4+/5 to improve functional  mobiltiy.    Baseline grossly 4/5    Status Achieved                 Plan - 01/04/20 0911    Clinical Impression Statement Denise Riggs reported difficulty sleeping last Thursday.  She thinks it is related to seeing me later in the day (she usually comes to PT in the morning) after working all day.  Sleep was better Friday and back to normal Saturday.  With continued daily HEP compliance, Denise Riggs's prognosis to maintain and continue to make some functional gains is good.  She will look at her health insurance to see if she wants to finish her last 2 PT visits or make today her last visit.    Personal Factors and Comorbidities Comorbidity 1    Comorbidities DM    Examination-Activity Limitations Lift;Reach Overhead;Sleep    Examination-Participation Restrictions Community Activity;Cleaning    Stability/Clinical Decision Making Stable/Uncomplicated    Rehab Potential Good    PT Duration 6 weeks    PT Treatment/Interventions ADLs/Self Care Home Management;Cryotherapy;Electrical Stimulation;Iontophoresis 4mg /ml Dexamethasone;Moist Heat;Ultrasound;Functional mobility training;Therapeutic activities;Therapeutic exercise;Neuromuscular re-education;Patient/family education;Manual techniques;Passive range of motion;Dry needling;Taping;Spinal Manipulations    PT Next Visit Plan DC?    PT Home Exercise Plan [email or web address]    Consulted and Agree with Plan of Care Patient           Patient will benefit from skilled therapeutic intervention in order to improve the following deficits and impairments:  Pain,Postural dysfunction,Decreased strength,Impaired UE functional use  Visit Diagnosis: Abnormal posture  Muscle weakness (generalized)  Stiffness of right shoulder, not elsewhere classified  Chronic right shoulder pain     Problem List Patient Active Problem List   Diagnosis Date Noted  . Rotator cuff tendinitis, right 10/29/2019  . Anterior subluxation of right shoulder 10/29/2019  . Adenomatous  polyps 10/13/2019  . Pain in right shoulder 04/28/2019    04/30/2019 PT, MPT 01/04/2020, 9:15 AM  [facility] [address] [place], [place], [place] Phone: [phone]   Fax:  [phone]  Name: Denise Riggs MRN: Diannia Ruder Date of Birth: 08-12-1965

## 2020-01-11 ENCOUNTER — Encounter: Payer: 59 | Admitting: Physical Therapy

## 2020-01-18 ENCOUNTER — Ambulatory Visit (INDEPENDENT_AMBULATORY_CARE_PROVIDER_SITE_OTHER): Payer: 59 | Admitting: Physical Therapy

## 2020-01-18 ENCOUNTER — Other Ambulatory Visit: Payer: Self-pay

## 2020-01-18 DIAGNOSIS — M25561 Pain in right knee: Secondary | ICD-10-CM

## 2020-01-18 DIAGNOSIS — R293 Abnormal posture: Secondary | ICD-10-CM | POA: Diagnosis not present

## 2020-01-18 DIAGNOSIS — M6281 Muscle weakness (generalized): Secondary | ICD-10-CM

## 2020-01-18 DIAGNOSIS — M25511 Pain in right shoulder: Secondary | ICD-10-CM | POA: Diagnosis not present

## 2020-01-18 DIAGNOSIS — M25611 Stiffness of right shoulder, not elsewhere classified: Secondary | ICD-10-CM

## 2020-01-18 DIAGNOSIS — G8929 Other chronic pain: Secondary | ICD-10-CM

## 2020-01-18 NOTE — Therapy (Addendum)
[facility] Physical Therapy [address] [place], [place], [address] Phone: [phone]   Fax:  [phone]  Physical Therapy Treatment/DC  Patient Details  Name: Denise Riggs MRN: [id number] Date of Birth: [birth year] Referring Provider (PT): Rodell Perna MD  PHYSICAL THERAPY DISCHARGE SUMMARY  Visits from Start of Care: 17  Current functional level related to goals / functional outcomes: See note-functional and less pain with a RTC tear   Remaining deficits: See note   Education / Equipment: HEP Plan: Patient agrees to discharge.  Patient goals were met. Patient is being discharged due to meeting the stated rehab goals.  ?????     Encounter Date: 01/18/2020   PT End of Session - 01/18/20 0816    Visit Number 17    Number of Visits 19    Date for PT Re-Evaluation 01/29/20    Authorization Type Beginning 12/21/2019, 1x/ week for 6 weeks if approved by Dr. Lorin Mercy    PT Start Time 0802    PT Stop Time 0841    PT Time Calculation (min) 39 min    Activity Tolerance Patient tolerated treatment well;No increased pain;Patient limited by fatigue    Behavior During Therapy [facility] for tasks assessed/performed           Past Medical History:  Diagnosis Date  . Diabetes mellitus without complication [facility])     Past Surgical History:  Procedure Laterality Date  . COSMETIC SURGERY    . FLEXIBLE SIGMOIDOSCOPY N/A 10/13/2019   Procedure: FLEXIBLE SIGMOIDOSCOPY;  Surgeon: Wilford Corner, MD;  Location: [facility];  Service: Endoscopy;  Laterality: N/A;    There were no vitals filed for this visit.   Subjective Assessment - 01/18/20 0807    Subjective Pt states her sleeping is a little better. Pt reporting no pain upon arrival. Pt still reporting weakness.    Pertinent History DM    Limitations House hold activities;Lifting    Diagnostic tests X-ray    Patient Stated Goals "be able to get back to normal"    Currently in Pain? No/denies    Pain  Onset More than a month ago              [facility] PT Assessment - 01/18/20 0001      Assessment   Medical Diagnosis M25.511, R shoulder    Referring Provider (PT) Rodell Perna MD    Onset Date/Surgical Date 04/20/19      AROM   Right Shoulder Flexion 165 Degrees    Right Shoulder External Rotation 70 Degrees    Right Shoulder Horizontal ABduction 155 Degrees                         [facility] Adult PT Treatment/Exercise - 01/18/20 0001      Exercises   Exercises Shoulder      Shoulder Exercises: Supine   Protraction Strengthening;20 reps;Both   3 seconds   Protraction Weight (lbs) 6#    External Rotation AROM;Right;10 reps   10 seconds   Internal Rotation AROM;Right;10 reps   10 seconds     Shoulder Exercises: Prone   Other Prone Exercises T's, W's, I's x 10 holding 3 seconds each      Shoulder Exercises: Standing   External Rotation Strengthening;Right;10 reps;Theraband   2 sets slow eccentrics   Theraband Level (Shoulder External Rotation) Level 2 (Red)   2 sets   Row Strengthening;Both;20 reps;Theraband   slow eccentrics   Theraband Level (Shoulder Row) Level  4 (Blue)    Retraction Strengthening;Both;10 reps   Shoulder blade pinches  5 seconds     Shoulder Exercises: ROM/Strengthening   UBE (Upper Arm Bike) 4 minutes forward and back 2.5 Level                       PT Long Term Goals - 01/18/20 0829      PT LONG TERM GOAL #1   Title Pt will be able to perform HEP indepdently and progress appropriately.    Status Achieved      PT LONG TERM GOAL #2   Title Pt will be able to lift and lower 10 pound object from counter to upper shelf with pain </=2/10.    Baseline fatigue  with lifting 7# weight.    Time 6    Period Weeks      PT LONG TERM GOAL #3   Title Pt will improve her FOTO score to </= 35% limitation.    Baseline 40% limitation    Time 6    Period Weeks    Status On-going      PT LONG TERM GOAL #4   Title Pt will be able to  improve R shoulder mobility to fasten her bra.    Baseline Pt stating she is able to perform    Period Weeks    Status Achieved      PT LONG TERM GOAL #5   Title Pt will be able to improve R shoulder strength to >/= 4+/5 to improve functional mobiltiy.    Status On-going                 Plan - 01/18/20 0818    Clinical Impression Statement Pt arriving today reporting no pain upon arrival. Pt reporting improved sleep. Pt still feeling weakness in right shoulder with some ADL's. Pt tolerating exercises well. Progressing with overall strength and rotatational movements. Pt agreeing to one more visit to plan discharge and HEP progression.    Personal Factors and Comorbidities Comorbidity 1    Comorbidities DM    Examination-Activity Limitations Lift;Reach Overhead;Sleep    Examination-Participation Restrictions Community Activity;Cleaning    Stability/Clinical Decision Making Stable/Uncomplicated    Rehab Potential Good    PT Duration 6 weeks    PT Treatment/Interventions ADLs/Self Care Home Management;Cryotherapy;Electrical Stimulation;Iontophoresis 25m/ml Dexamethasone;Moist Heat;Ultrasound;Functional mobility training;Therapeutic activities;Therapeutic exercise;Neuromuscular re-education;Patient/family education;Manual techniques;Passive range of motion;Dry needling;Taping;Spinal Manipulations    PT Next Visit Plan Discharge next visit, HEP progression.    PT Home Exercise Plan [email or web address]    Consulted and Agree with Plan of Care Patient           Patient will benefit from skilled therapeutic intervention in order to improve the following deficits and impairments:  Pain,Postural dysfunction,Decreased strength,Impaired UE functional use  Visit Diagnosis: Muscle weakness (generalized)  Stiffness of right shoulder, not elsewhere classified  Chronic right shoulder pain  Abnormal posture  Chronic pain of right knee  Acute pain of right shoulder     Problem List Patient  Active Problem List   Diagnosis Date Noted  . Rotator cuff tendinitis, right 10/29/2019  . Anterior subluxation of right shoulder 10/29/2019  . Adenomatous polyps 10/13/2019  . Pain in right shoulder 04/28/2019    JOretha Caprice PT, MPT 01/18/2020, 8:41 AM  RFarley LyPT, MPT  [facility] [address]GH[address] [place] [address]Phone: [phone]  Fax:  [phone] Name: Denise DaliaMRN: [id number]  Date of Birth: Dec 19, 1965

## 2020-01-26 ENCOUNTER — Encounter: Payer: 59 | Admitting: Physical Therapy

## 2020-03-02 ENCOUNTER — Encounter: Payer: Self-pay | Admitting: Orthopaedic Surgery

## 2020-03-02 ENCOUNTER — Ambulatory Visit (INDEPENDENT_AMBULATORY_CARE_PROVIDER_SITE_OTHER): Payer: 59 | Admitting: Orthopaedic Surgery

## 2020-03-02 VITALS — BP 108/67 | HR 82 | Ht 67.0 in | Wt 227.0 lb

## 2020-03-02 DIAGNOSIS — S43011S Anterior subluxation of right humerus, sequela: Secondary | ICD-10-CM | POA: Diagnosis not present

## 2020-03-02 NOTE — Progress Notes (Signed)
Office Visit Note   Patient: Denise Riggs           Date of Birth: 19-Dec-1965           MRN: [id number] Visit Date: 03/02/2020              Requested by: Johna Roles, PA [address] [place],  [place] [address] PCP: Johna Roles, [place]   Assessment & Plan: Visit Diagnoses:  1. Anterior subluxation of right shoulder, sequela     Plan: Patient is done well with therapy. I discussed with her not trying to increase the amount of weight she can lift overhead since having torn those ligaments with increasing low the shoulders likely go to sublux with increased symptoms increased pain and more visits to the doctor. She is able to participate in normal activities since including resuming her gardening. She has increased problems she can return. She is happy with the results of therapy.  Follow-Up Instructions: No follow-ups on file.   Orders:  No orders of the defined types were placed in this encounter.  No orders of the defined types were placed in this encounter.     Procedures: No procedures performed   Clinical Data: No additional findings.   Subjective: Chief Complaint  Patient presents with  . Right Shoulder - Follow-up    Fall 04/20/2019    HPI 10 months post right shoulder subluxation relocation with the anterior-inferior glenoid fracture. She is done well with therapy some get her arm up overhead she can fix her hair. She is on been able to progress to 4 to 5 pounds lifting overhead and I discussed with her she can stop trying to increase it.  She started doing some gardening and spring activities and seems to do well only occasional but does she feel a slight twinge. She can abduct her arm Rotate almost to 90 without discomfort. She has been doing her home exercise program.  Review of Systems Reviewed updated unchanged.  Objective: Vital Signs: BP 108/67   Pulse 82   Ht 5\' 7"  (1.702 m)   Wt 227 lb (103 kg)   BMI 35.55 kg/m    Physical Exam Constitutional:      Appearance: She is well-developed.  HENT:     Head: Normocephalic.     Right Ear: External ear normal.     Left Ear: External ear normal.  Eyes:     Pupils: Pupils are equal, round, and reactive to light.  Neck:     Thyroid: No thyromegaly.     Trachea: No tracheal deviation.  Cardiovascular:     Rate and Rhythm: Normal rate.  Pulmonary:     Effort: Pulmonary effort is normal.  Abdominal:     Palpations: Abdomen is soft.  Skin:    General: Skin is warm and dry.  Neurological:     Mental Status: She is alert and oriented to person, place, and time.  Psychiatric:        Mood and Affect: Mood and affect normal.        Behavior: Behavior normal.     Ortho Exam patient can reach overhead rapidly and is top of her head with no problems. Abduction 90 external rotation 80 without pain or discomfort. Sensation of her hand is intact. Specialty Comments:  No specialty comments available.  Imaging: No results found.   PMFS History: Patient Active Problem List   Diagnosis Date Noted  . Rotator cuff tendinitis, right  10/29/2019  . Anterior subluxation of right shoulder 10/29/2019  . Adenomatous polyps 10/13/2019  . Pain in right shoulder 04/28/2019   Past Medical History:  Diagnosis Date  . Diabetes mellitus without complication ([facility])     Family History  Problem Relation Age of Onset  . Diabetes Mother   . Heart disease Mother   . Hypertension Mother   . Stroke Mother   . Diabetes Father     Past Surgical History:  Procedure Laterality Date  . COSMETIC SURGERY    . FLEXIBLE SIGMOIDOSCOPY N/A 10/13/2019   Procedure: FLEXIBLE SIGMOIDOSCOPY;  Surgeon: Wilford Corner, MD;  Location: [facility];  Service: Endoscopy;  Laterality: N/A;   Social History   Occupational History  . Not on file  Tobacco Use  . Smoking status: Never Smoker  . Smokeless tobacco: Never Used  Vaping Use  . Vaping Use: Never used  Substance and  Sexual Activity  . Alcohol use: Not on file  . Drug use: Not on file  . Sexual activity: Not on file

## 2020-04-04 ENCOUNTER — Ambulatory Visit
Admission: RE | Admit: 2020-04-04 | Discharge: 2020-04-04 | Disposition: A | Discharge status: Home / Self Care | Payer: 59 | Source: Ambulatory Visit | Attending: Nurse Practitioner | Admitting: Nurse Practitioner

## 2020-04-04 ENCOUNTER — Other Ambulatory Visit: Payer: Self-pay

## 2020-04-04 DIAGNOSIS — N6489 Other specified disorders of breast: Secondary | ICD-10-CM

## 2020-08-10 ENCOUNTER — Other Ambulatory Visit: Payer: Self-pay | Admitting: Internal Medicine

## 2020-08-10 DIAGNOSIS — N939 Abnormal uterine and vaginal bleeding, unspecified: Secondary | ICD-10-CM

## 2020-08-22 ENCOUNTER — Other Ambulatory Visit: Payer: Self-pay

## 2020-08-22 ENCOUNTER — Ambulatory Visit
Admission: RE | Admit: 2020-08-22 | Discharge: 2020-08-22 | Disposition: A | Discharge status: Home / Self Care | Payer: 59 | Source: Ambulatory Visit | Attending: Internal Medicine | Admitting: Internal Medicine

## 2020-08-22 DIAGNOSIS — N939 Abnormal uterine and vaginal bleeding, unspecified: Secondary | ICD-10-CM

## 2020-10-13 NOTE — H&P (Signed)
Denise Riggs is an 55 y.o. G2P2 who is admitted for Hysteroscopy with Dilation and Curettage and possible Myosure Polypectomy for AUB.  Originally presented on 09/02/2020 with reports of an abnormal episode of bleeding in July (after having skipped her period in June). States she had a normal cycle that lasted 7 days in July but then she began to have heavy bleeding with clots 3-4 days after her menses. She also had another episode of heavy bleeding with clots on 8/11 and 8/13.  Outpatient EMB was attempted but unable to perform due to cervical stenosis.  Last Pap (2021): NILM (cytology only)  Previous TVUS (08/22/20): FINDINGS: Uterus   Measurements: 12.5 x 4.2 x 5.2 cm = volume: 144 mL. Mildly heterogeneous myometrium. Suboptimal fundal visualization. Small fundal leiomyoma 1.8 x 1.8 x 1.9 cm, subserosal. An additional area of heterogeneous echogenicity is seen centrally at the lower uterine segment/cervix 3.6 x 3.3 x 1.8 cm, probably representing prominent endocervical mucosa and glands. No discrete mass/polyp.   Endometrium   Thickness: 7 mm.  No endometrial fluid or mass   Right ovary   Measurements: 4.9 and 4.3 x 4.3 cm = volume: 47 mL. Simple cyst in RIGHT ovary measuring 3.7 cm maximum diameter; No follow up imaging recommended. Note: This recommendation does not apply to premenarchal patients or to those with increased risk (genetic, family history, elevated tumor markers or other high-risk factors) of ovarian cancer. Reference: Radiology 2019 Nov; 293(2):359-371.   Left ovary   Not visualized, likely obscured by bowel   Other findings   No free pelvic fluid.  No other adnexal masses.   IMPRESSION: 1.9 cm diameter subserosal leiomyoma at fundus of uterus.   Simple cyst RIGHT ovary 3.7 cm greatest diameter; no follow-up imaging recommended.   No additional pelvic sonographic abnormalities.    Patient Active Problem List   Diagnosis Date Noted    Rotator cuff tendinitis, right 10/29/2019   Anterior subluxation of right shoulder 10/29/2019   Adenomatous polyps 10/13/2019   Pain in right shoulder 04/28/2019    MEDICAL/FAMILY/SOCIAL HX: No LMP recorded.    Past Medical History:  Diagnosis Date   Diabetes mellitus without complication ([place])     Past Surgical History:  Procedure Laterality Date   COSMETIC SURGERY     FLEXIBLE SIGMOIDOSCOPY N/A 10/13/2019   Procedure: FLEXIBLE SIGMOIDOSCOPY;  Surgeon: Wilford Corner, MD;  [facility]: [facility];  Service: Endoscopy;  Laterality: N/A;    Family History  Problem Relation Age of Onset   Diabetes Mother    Heart disease Mother    Hypertension Mother    Stroke Mother    Diabetes Father     Social History:  reports that she has never smoked. She has never used smokeless tobacco. No history on file for alcohol use and drug use.  ALLERGIES/MEDS:  Allergies: No Known Allergies  No medications prior to admission.     Review of Systems  Constitutional: Negative.   HENT: Negative.    Eyes: Negative.   Respiratory: Negative.    Cardiovascular: Negative.   Gastrointestinal: Negative.   Genitourinary: Negative.   Musculoskeletal: Negative.   Skin: Negative.   Neurological: Negative.   Endo/Heme/Allergies: Negative.   Psychiatric/Behavioral: Negative.     There were no vitals taken for this visit. Gen:  NAD, pleasant and cooperative Cardio:  RRR Pulm:  CTAB, no wheezes/rales/rhonchi Abd:  Soft, non-distended, non-tender throughout, no rebound/guarding Ext:  No bilateral LE edema, no bilateral calf tenderness Pelvic: Labia - unremarkable, vagina -  pink moist mucosa, no lesions or abnormal discharge, scant blood in vaginal vault, cervix - no discharge or CMT, stenotic cervical os, very posterior (Long Richardson speculum used), adnexa - no masses or tenderness, uterus - nontender and normal size on palpation, exam limited due to habitus   No results found for this or  any previous visit (from the past 24 hour(s)).  No results found.   ASSESSMENT/PLAN: Denise Riggs is a 55 y.o. G2P2 who is admitted for Hysteroscopy with Dilation and Curettage and possible Myosure Polypectomy for AUB.  - Admit to [place] labs (CBC, T&S, COVID screen) - Diet:  Per anesthesia (ERAS) - IVF:  Per anesthesia - VTE Prophylaxis:  SCDs - Antibiotics: None - D/C home same day  Consents: I discussed with the patient that this surgery is performed to look inside the uterus and remove the uterine lining.  Prior to surgery, the risks and benefits of the surgery, as well as alternative treatments, have been discussed.  The risks include, but are not limited to bleeding, including the need for a blood transfusion, infection, damage to organs and tissues, including uterine perforation, requiring additional surgery, postoperative pain, short-term and long-term, failure of the procedure to control symptoms, need for hysterectomy to control bleeding, fluid overload, which could create electrolyte abnormalities and the need to stop the procedure before completion, inability to safely complete the procedure, deep vein thrombosis and/or pulmonary embolism, painful intercourse, complications the course of which cannot be predicted or prevented, and death.  Patient was consented for blood products.  The patient is aware that bleeding may result in the need for a blood transfusion which includes risk of transmission of HIV (1:2 million), Hepatitis C (1:2 million), and Hepatitis B (1:200 thousand) and transfusion reaction.  Patient voiced understanding of the above risks as well as understanding of indications for blood transfusion.   Drema Dallas, DO [phone] (office)

## 2020-10-14 ENCOUNTER — Encounter (HOSPITAL_BASED_OUTPATIENT_CLINIC_OR_DEPARTMENT_OTHER): Payer: Self-pay | Admitting: Obstetrics and Gynecology

## 2020-10-14 ENCOUNTER — Other Ambulatory Visit: Payer: Self-pay

## 2020-10-18 ENCOUNTER — Encounter (HOSPITAL_BASED_OUTPATIENT_CLINIC_OR_DEPARTMENT_OTHER)
Admission: RE | Admit: 2020-10-18 | Discharge: 2020-10-18 | Disposition: A | Discharge status: Home / Self Care | Payer: 59 | Source: Ambulatory Visit | Attending: Obstetrics and Gynecology | Admitting: Obstetrics and Gynecology

## 2020-10-18 DIAGNOSIS — Z01812 Encounter for preprocedural laboratory examination: Secondary | ICD-10-CM | POA: Diagnosis not present

## 2020-10-18 LAB — BASIC METABOLIC PANEL
Anion gap: 8 (ref 5–15)
BUN: 11 mg/dL (ref 6–20)
CO2: 26 mmol/L (ref 22–32)
Calcium: 8.8 mg/dL — ABNORMAL LOW (ref 8.9–10.3)
Chloride: 102 mmol/L (ref 98–111)
Creatinine, Ser: 0.72 mg/dL (ref 0.44–1.00)
GFR, Estimated: >60 mL/min (ref >60)
Glucose, Bld: 84 mg/dL (ref 70–99)
Potassium: 4.2 mmol/L (ref 3.5–5.1)
Sodium: 136 mmol/L (ref 135–145)

## 2020-10-18 LAB — CBC
HCT: 38.2 % (ref 36.0–46.0)
Hemoglobin: 12.1 g/dL (ref 12.0–15.0)
MCH: 26.6 pg (ref 26.0–34.0)
MCHC: 31.7 g/dL (ref 30.0–36.0)
MCV: 84 fL (ref 80.0–100.0)
Platelets: 286 10*3/uL (ref 150–400)
RBC: 4.55 MIL/uL (ref 3.87–5.11)
RDW: 14.4 % (ref 11.5–15.5)
WBC: 8.5 10*3/uL (ref 4.0–10.5)
nRBC: 0 % (ref 0.0–0.2)

## 2020-10-18 LAB — TYPE AND SCREEN
ABO/RH(D): O NEG
Antibody Screen: NEGATIVE

## 2020-10-18 NOTE — Progress Notes (Signed)

## 2020-10-24 ENCOUNTER — Encounter (HOSPITAL_BASED_OUTPATIENT_CLINIC_OR_DEPARTMENT_OTHER)
Admission: RE | Disposition: A | Discharge status: Home / Self Care | Payer: Self-pay | Source: Ambulatory Visit | Attending: Obstetrics and Gynecology

## 2020-10-24 ENCOUNTER — Ambulatory Visit (HOSPITAL_BASED_OUTPATIENT_CLINIC_OR_DEPARTMENT_OTHER): Payer: 59 | Admitting: Certified Registered"

## 2020-10-24 ENCOUNTER — Other Ambulatory Visit: Payer: Self-pay

## 2020-10-24 ENCOUNTER — Ambulatory Visit (HOSPITAL_BASED_OUTPATIENT_CLINIC_OR_DEPARTMENT_OTHER)
Admission: RE | Admit: 2020-10-24 | Discharge: 2020-10-24 | Disposition: A | Discharge status: Home / Self Care | Payer: 59 | Source: Ambulatory Visit | Attending: Obstetrics and Gynecology | Admitting: Obstetrics and Gynecology

## 2020-10-24 ENCOUNTER — Encounter (HOSPITAL_BASED_OUTPATIENT_CLINIC_OR_DEPARTMENT_OTHER): Payer: Self-pay | Admitting: Obstetrics and Gynecology

## 2020-10-24 DIAGNOSIS — N939 Abnormal uterine and vaginal bleeding, unspecified: Secondary | ICD-10-CM | POA: Diagnosis present

## 2020-10-24 DIAGNOSIS — Z539 Procedure and treatment not carried out, unspecified reason: Secondary | ICD-10-CM | POA: Insufficient documentation

## 2020-10-24 DIAGNOSIS — E119 Type 2 diabetes mellitus without complications: Secondary | ICD-10-CM | POA: Insufficient documentation

## 2020-10-24 DIAGNOSIS — D252 Subserosal leiomyoma of uterus: Secondary | ICD-10-CM | POA: Insufficient documentation

## 2020-10-24 LAB — GLUCOSE, CAPILLARY
Glucose-Capillary: 111 mg/dL — ABNORMAL HIGH (ref 70–99)
Glucose-Capillary: 120 mg/dL — ABNORMAL HIGH (ref 70–99)

## 2020-10-24 LAB — POCT PREGNANCY, URINE: Preg Test, Ur: NEGATIVE

## 2020-10-24 LAB — POCT HEMOGLOBIN-HEMACUE
Hemoglobin: 9.7 g/dL — ABNORMAL LOW (ref 12.0–15.0)
Hemoglobin: 20.2 g/dL — ABNORMAL HIGH (ref 12.0–15.0)

## 2020-10-24 LAB — ABO/RH: ABO/RH(D): O NEG

## 2020-10-24 SURGERY — CANCELLED PROCEDURE
Anesthesia: General

## 2020-10-24 MED ORDER — OXYCODONE HCL 5 MG PO TABS: 5.0000 mg | ORAL_TABLET | Freq: Once | ORAL | Status: DC | PRN

## 2020-10-24 MED ORDER — ACETAMINOPHEN 10 MG/ML IV SOLN: 1000.0000 mg | Freq: Once | INTRAVENOUS | Status: DC | PRN

## 2020-10-24 MED ORDER — FENTANYL CITRATE (PF) 100 MCG/2ML IJ SOLN
INTRAMUSCULAR | Status: AC
  Filled 2020-10-24: qty 2

## 2020-10-24 MED ORDER — SODIUM CHLORIDE 0.9 % IR SOLN: Status: DC | PRN

## 2020-10-24 MED ORDER — AMISULPRIDE (ANTIEMETIC) 5 MG/2ML IV SOLN: 10.0000 mg | Freq: Once | INTRAVENOUS | Status: DC | PRN

## 2020-10-24 MED ORDER — LACTATED RINGERS IV SOLN: INTRAVENOUS | Status: DC

## 2020-10-24 MED ORDER — MIDAZOLAM HCL 2 MG/2ML IJ SOLN
INTRAMUSCULAR | Status: AC
  Filled 2020-10-24: qty 2

## 2020-10-24 MED ORDER — ONDANSETRON HCL 4 MG/2ML IJ SOLN
4.0000 mg | Freq: Once | INTRAMUSCULAR | Status: AC
  Administered 2020-10-24: 4 mg via INTRAVENOUS

## 2020-10-24 MED ORDER — ACETAMINOPHEN 160 MG/5ML PO SOLN: 325.0000 mg | ORAL | Status: DC | PRN

## 2020-10-24 MED ORDER — LIDOCAINE 2% (20 MG/ML) 5 ML SYRINGE
INTRAMUSCULAR | Status: AC
  Filled 2020-10-24: qty 5

## 2020-10-24 MED ORDER — PROMETHAZINE HCL 25 MG/ML IJ SOLN: 6.2500 mg | INTRAMUSCULAR | Status: DC | PRN

## 2020-10-24 MED ORDER — ONDANSETRON HCL 4 MG/2ML IJ SOLN
INTRAMUSCULAR | Status: AC
  Filled 2020-10-24: qty 2

## 2020-10-24 MED ORDER — OXYCODONE HCL 5 MG/5ML PO SOLN
5.0000 mg | Freq: Once | ORAL | Status: DC | PRN
Start: 2020-10-24 — End: 2020-10-24

## 2020-10-24 MED ORDER — ACETAMINOPHEN 325 MG PO TABS: 325.0000 mg | ORAL_TABLET | ORAL | Status: DC | PRN

## 2020-10-24 MED ORDER — FENTANYL CITRATE (PF) 100 MCG/2ML IJ SOLN: 25.0000 ug | INTRAMUSCULAR | Status: DC | PRN

## 2020-10-24 MED ORDER — DEXAMETHASONE SODIUM PHOSPHATE 10 MG/ML IJ SOLN
INTRAMUSCULAR | Status: AC
  Filled 2020-10-24: qty 1

## 2020-10-24 MED ORDER — SILVER NITRATE-POT NITRATE 75-25 % EX MISC
CUTANEOUS | Status: AC
  Filled 2020-10-24: qty 10

## 2020-10-24 MED ORDER — PROPOFOL 10 MG/ML IV BOLUS
INTRAVENOUS | Status: AC
  Filled 2020-10-24: qty 40

## 2020-10-24 SURGICAL SUPPLY — 19 items
CANISTER SUCT 1200ML W/VALVE (MISCELLANEOUS) IMPLANT
CATH ROBINSON RED A/P 16FR (CATHETERS) IMPLANT
DEVICE MYOSURE LITE (MISCELLANEOUS) IMPLANT
DEVICE MYOSURE REACH (MISCELLANEOUS) IMPLANT
DILATOR CANAL MILEX (MISCELLANEOUS) IMPLANT
GAUZE 4X4 16PLY ~~LOC~~+RFID DBL (SPONGE) IMPLANT
GLOVE SURG ENC MOIS LTX SZ6.5 (GLOVE) IMPLANT
GLOVE SURG ENC TEXT LTX SZ6.5 (GLOVE) IMPLANT
GLOVE SURG UNDER POLY LF SZ7 (GLOVE) IMPLANT
GOWN STRL REUS W/ TWL LRG LVL3 (GOWN DISPOSABLE) IMPLANT
GOWN STRL REUS W/TWL LRG LVL3 (GOWN DISPOSABLE)
KIT PROCEDURE FLUENT (KITS) IMPLANT
KIT TURNOVER KIT B (KITS) ×2 IMPLANT
PACK VAGINAL MINOR WOMEN LF (CUSTOM PROCEDURE TRAY) IMPLANT
PAD OB MATERNITY 4.3X12.25 (PERSONAL CARE ITEMS) IMPLANT
SEAL ROD LENS SCOPE MYOSURE (ABLATOR) IMPLANT
SLEEVE SCD COMPRESS KNEE MED (STOCKING) IMPLANT
TOWEL GREEN STERILE FF (TOWEL DISPOSABLE) IMPLANT
UNDERPAD 30X36 HEAVY ABSORB (UNDERPADS AND DIAPERS) IMPLANT

## 2020-10-24 NOTE — Interval H&P Note (Signed)
History and Physical Interval Note:  10/24/2020 12:47 PM  Denise Riggs  has presented today for surgery, with the diagnosis of Abnormal Uterine Bleeding.  The various methods of treatment have been discussed with the patient and family. After consideration of risks, benefits and other options for treatment, the patient has consented to  Procedure(s): [place] (N/A) as a surgical intervention.  The patient's history has been reviewed, patient examined, no change in status, stable for surgery.  I have reviewed the patient's chart and labs.  Questions were answered to the patient's satisfaction.     Drema Dallas

## 2020-10-24 NOTE — Anesthesia Preprocedure Evaluation (Addendum)
Anesthesia Evaluation  Patient identified by MRN, date of birth, ID band Patient awake    Reviewed: Allergy & Precautions, NPO status , Patient's Chart, lab work & pertinent test results  Airway Mallampati: III  TM Distance: >3 FB Neck ROM: Full    Dental  (+) Teeth Intact, Dental Advisory Given   Pulmonary neg pulmonary ROS,    breath sounds clear to auscultation       Cardiovascular negative cardio ROS   Rhythm:Regular Rate:Normal     Neuro/Psych negative neurological ROS  negative psych ROS   GI/Hepatic negative GI ROS, Neg liver ROS,   Endo/Other  diabetes  Renal/GU negative Renal ROS     Musculoskeletal negative musculoskeletal ROS (+)   Abdominal (+) + obese,   Peds  Hematology negative hematology ROS (+)   Anesthesia Other Findings   Reproductive/Obstetrics                            Anesthesia Physical Anesthesia Plan  ASA: 2  Anesthesia Plan: General   Post-op Pain Management:    Induction: Intravenous, Rapid sequence and Cricoid pressure planned  PONV Risk Score and Plan: 4 or greater and Ondansetron, Dexamethasone, Midazolam and Scopolamine patch - Pre-op  Airway Management Planned: Oral ETT  Additional Equipment: None  Intra-op Plan:   Post-operative Plan: Extubation in OR  Informed Consent: I have reviewed the patients History and Physical, chart, labs and discussed the procedure including the risks, benefits and alternatives for the proposed anesthesia with the patient or authorized representative who has indicated his/her understanding and acceptance.     Dental advisory given  Plan Discussed with: CRNA  Anesthesia Plan Comments:       Anesthesia Quick Evaluation

## 2020-10-24 NOTE — Progress Notes (Signed)
After inserting patient's IV, patient briefly lost consciousness with snoring respirations. RN reclined patient's chair, lactated ringers was infusing in IV, and verbal/tactile stimulation provided. Within seconds, patient regained consciousness. Patient's vital signs stable after episode, with patient alert and oriented x4. Dr. Sabra Heck and Dr. Smith Robert at bedside and aware of situation. Patient complaining of nausea with a couple episodes of clear emesis. Order received for Zofran IV and administered by RN.

## 2020-10-24 NOTE — Progress Notes (Signed)
Upon arrival to OR, patient was sitting in wheelchair as she felt she was unable to walk to the OR. She still felt nauseated and was very lethargic-appearing. Previously thought to have had a vagal episode with IV placement but she has not yet fully recovered from the episode. Patient speaking normally and able to communicate without issue. Vital signs are within normal limits. After discussion with patient and Dr. Smith Robert (anesthesia), recommendation was made to postpone procedure. She was taken to PACU for IVFs and EKG assessment. Will work on rescheduling her procedure as soon as it is safe to do so.  Drema Dallas, DO

## 2020-10-25 NOTE — Progress Notes (Signed)
Called patient this morning to follow up on how she was feeling. Patient sates she is feeling better, able to ambulate without assistance, nausea subsided and able to keep down food and fluids. Reports still feeling weak and "washed out", and does not "feel like herself". Advised patient to contact PCP for follow up if she experiences ongoing symptoms.

## 2021-01-04 ENCOUNTER — Ambulatory Visit (HOSPITAL_BASED_OUTPATIENT_CLINIC_OR_DEPARTMENT_OTHER): Admit: 2021-01-04 | Payer: 59 | Admitting: Obstetrics and Gynecology

## 2021-01-04 ENCOUNTER — Encounter (HOSPITAL_BASED_OUTPATIENT_CLINIC_OR_DEPARTMENT_OTHER): Payer: Self-pay

## 2021-01-04 SURGERY — DILATATION & CURETTAGE/HYSTEROSCOPY WITH MYOSURE
Anesthesia: Choice

## 2021-08-27 IMAGING — MG DIGITAL SCREENING BILAT W/ TOMO W/ CAD
8 series · 8 of 24 positions shown · non-contrast
Comparison: None.

CLINICAL DATA: Screening.

EXAM:
DIGITAL SCREENING BILATERAL MAMMOGRAM WITH TOMO AND CAD

[L MLO synth-2D]
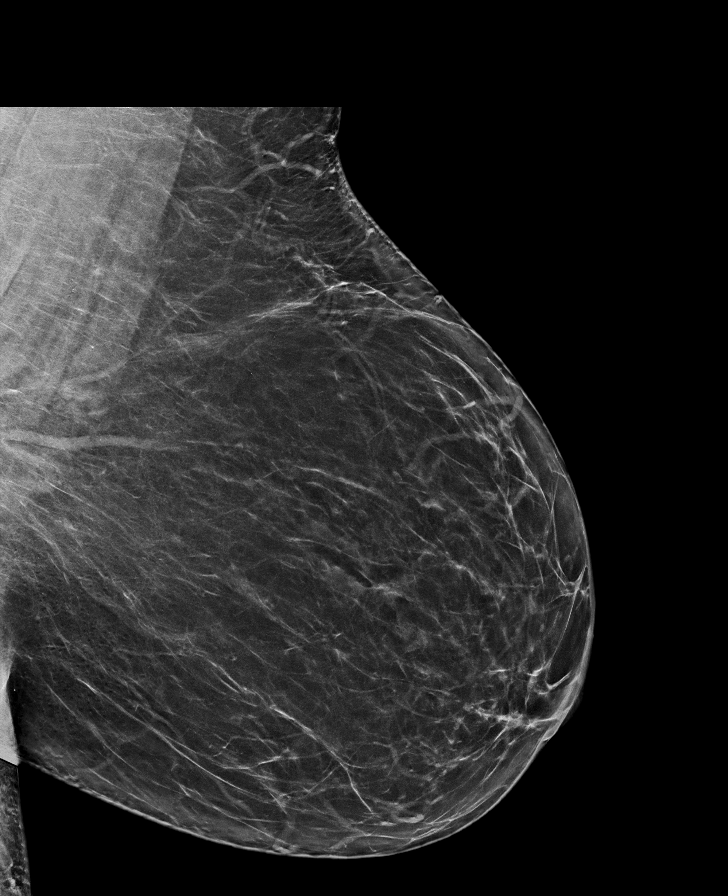

[L CC synth-2D]
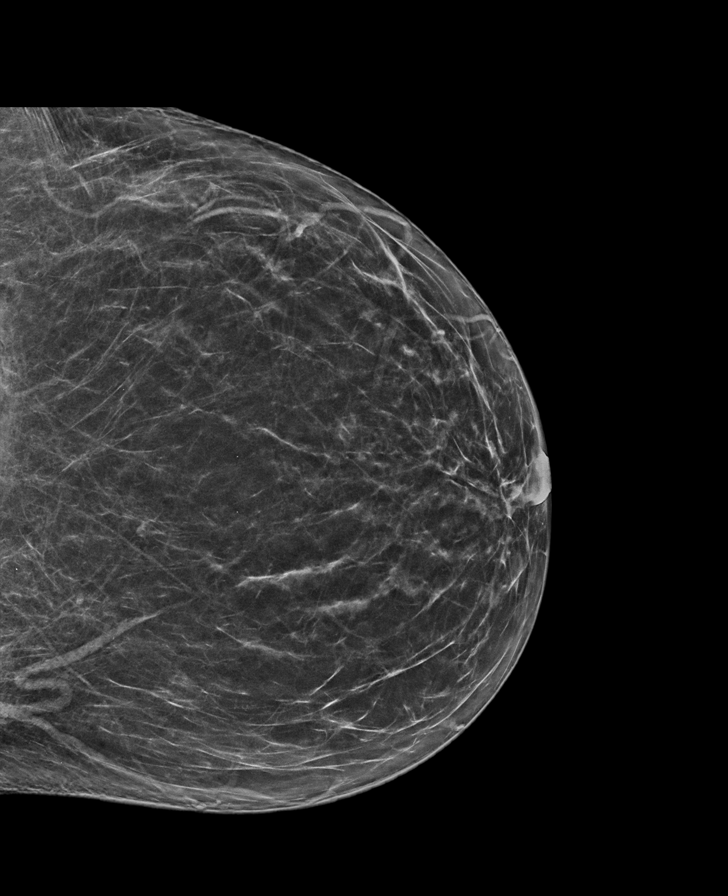

[R CC synth-2D]
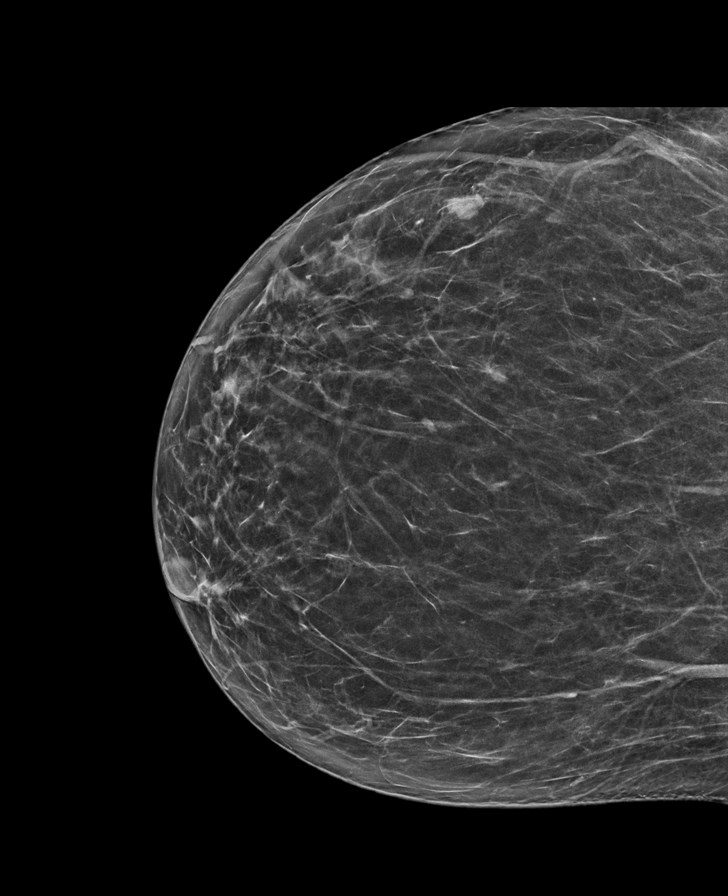

[R MLO synth-2D]
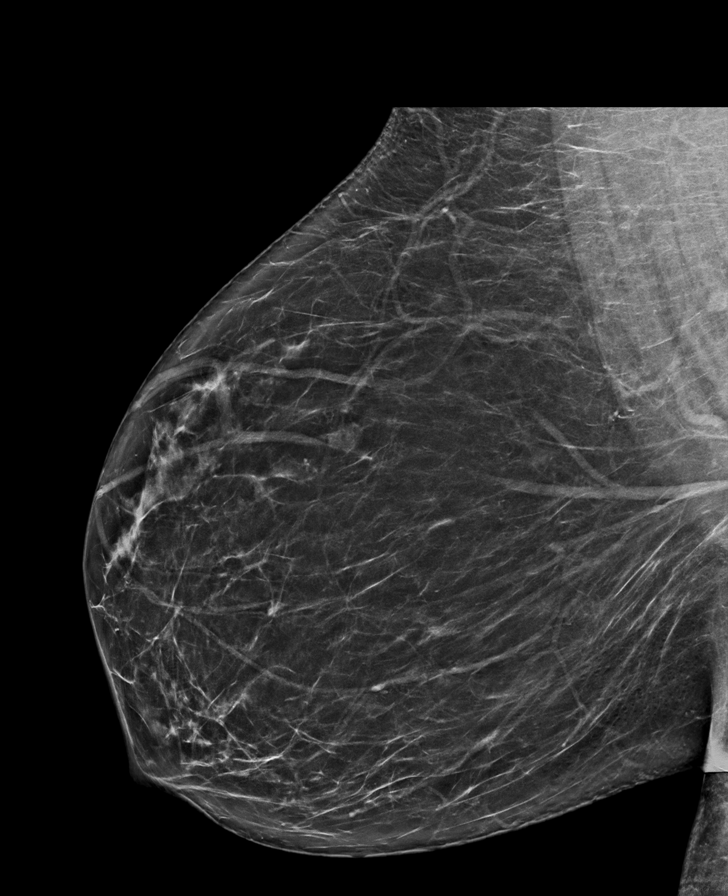

[R CC tomo · tomo slice 33/64.0]
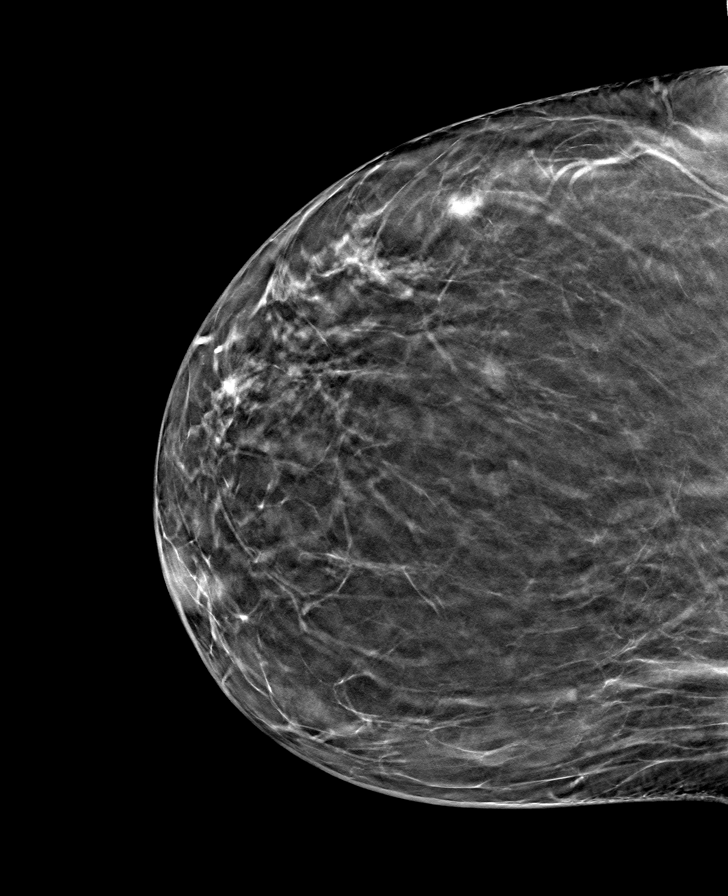

[L CC tomo · tomo slice 34/67.0]
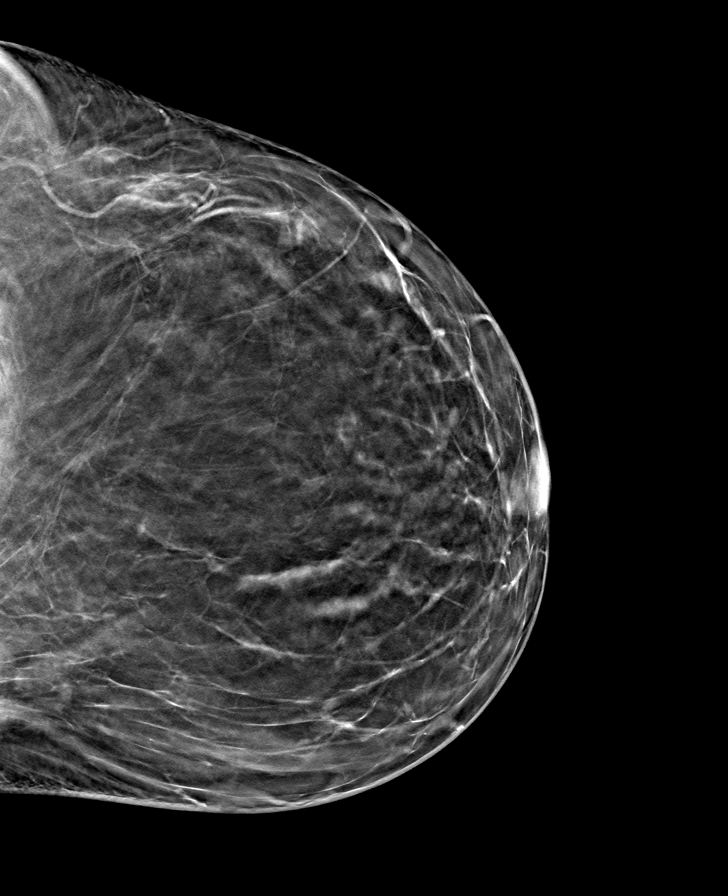

[L MLO tomo · tomo slice 39/77.0]
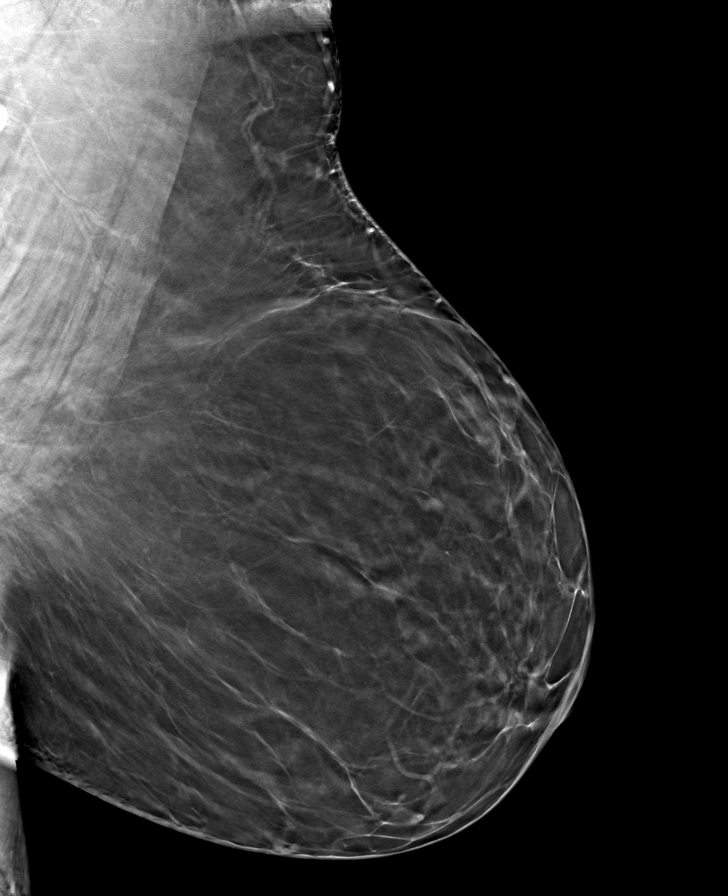

[R MLO tomo · tomo slice 38/75.0]
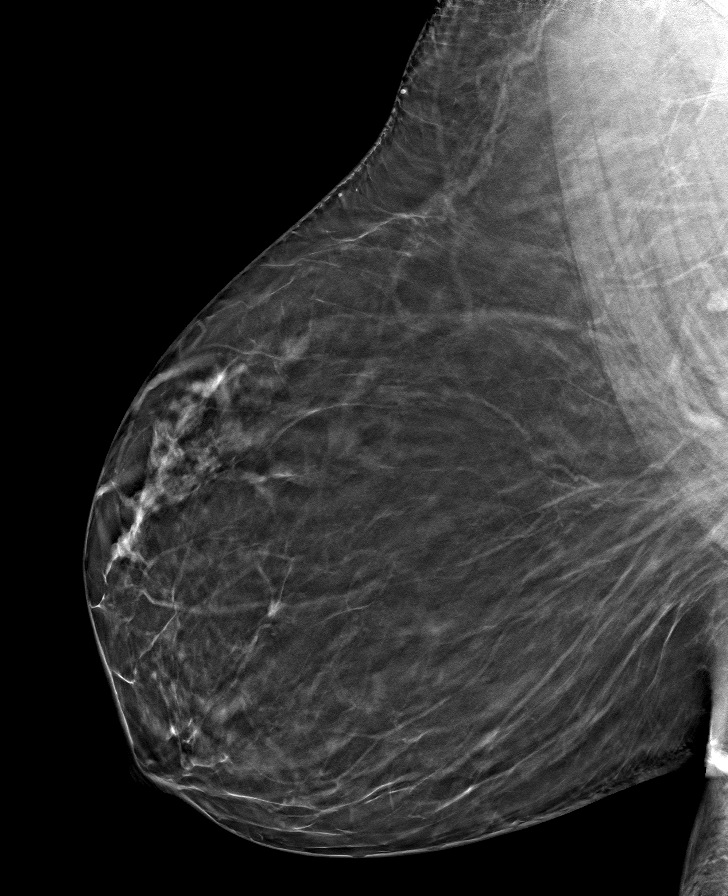

[8 of 24 positions shown; findings below may reference images not displayed]

ACR Breast Density Category b: There are scattered areas of
fibroglandular density.
FINDINGS: In the right breast, a possible mass and a separate possible
asymmetry warrant further evaluation. In the left breast, no
findings suspicious for malignancy. Images were processed with CAD.
IMPRESSION: Further evaluation is suggested for possible mass and separate
possible asymmetry in the right breast.

RECOMMENDATION:
Diagnostic mammogram and possibly ultrasound of the right breast.
(Code:[id number]-L-447)

The patient will be contacted regarding the findings, and additional
imaging will be scheduled.

BI-RADS CATEGORY  0: Incomplete. Need additional imaging evaluation
and/or prior mammograms for comparison.

## 2021-09-03 IMAGING — US US BREAST*R* LIMITED INC AXILLA
1 series · 9 of 9 positions shown · non-contrast
Comparison: Baseline screening mammogram dated 03/26/2019.

CLINICAL DATA: Patient returns today to evaluate a possible RIGHT
breast mass and a possible RIGHT breast asymmetry questioned on
recent baseline screening mammogram

EXAM:
DIGITAL DIAGNOSTIC RIGHT MAMMOGRAM WITH CAD AND TOMO
ULTRASOUND RIGHT BREAST

[Series 1: us breast*right* limited inc axilla · 0.06mm/px · 9 of 9 slices shown]
[im 1/9]
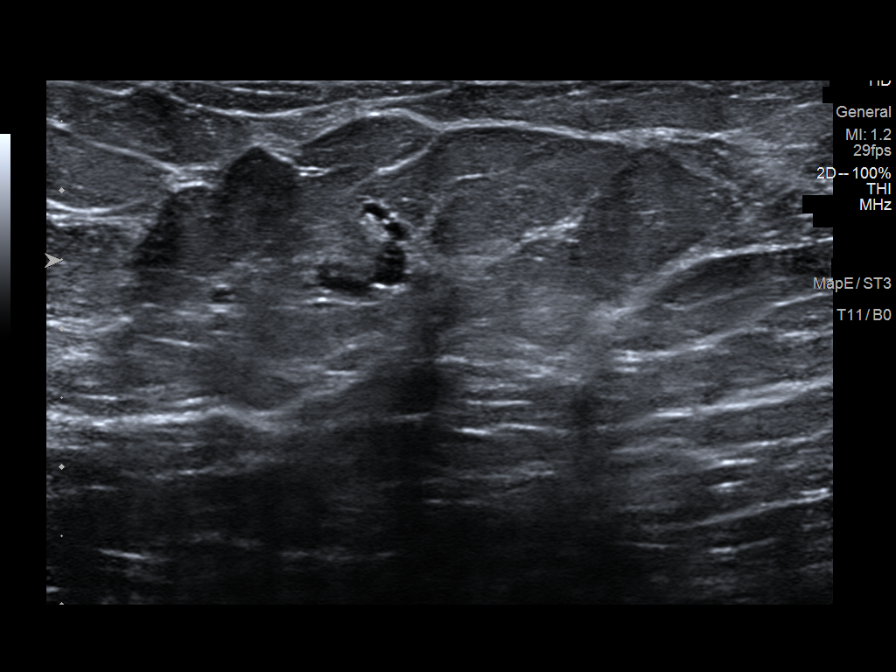
[im 2/9]
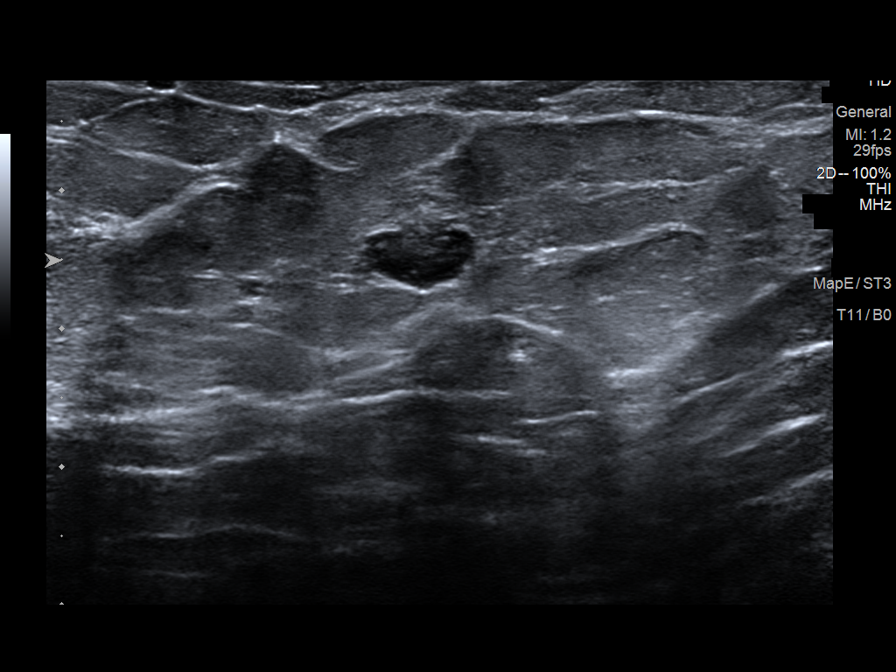
[im 3/9]
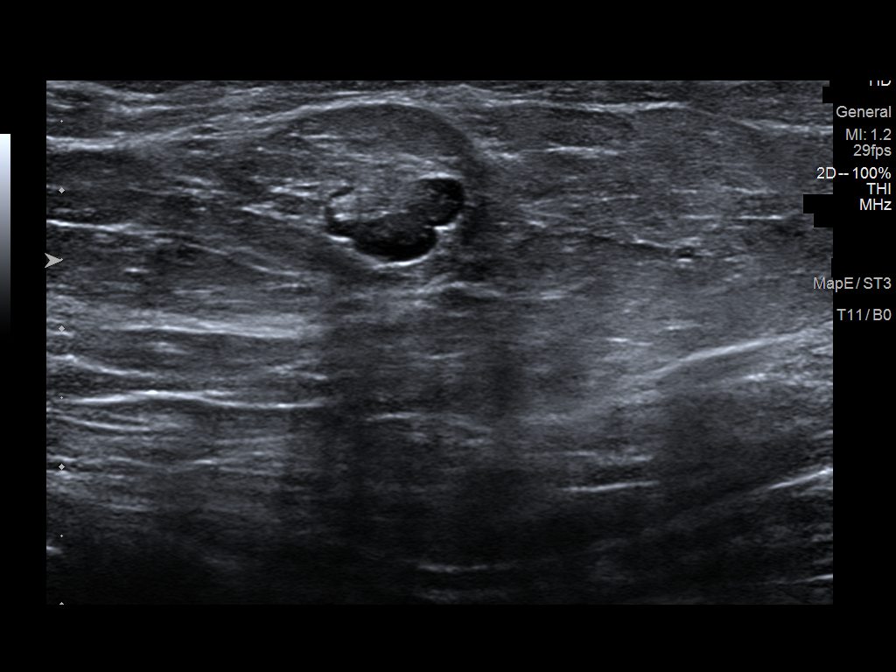
[im 4/9]
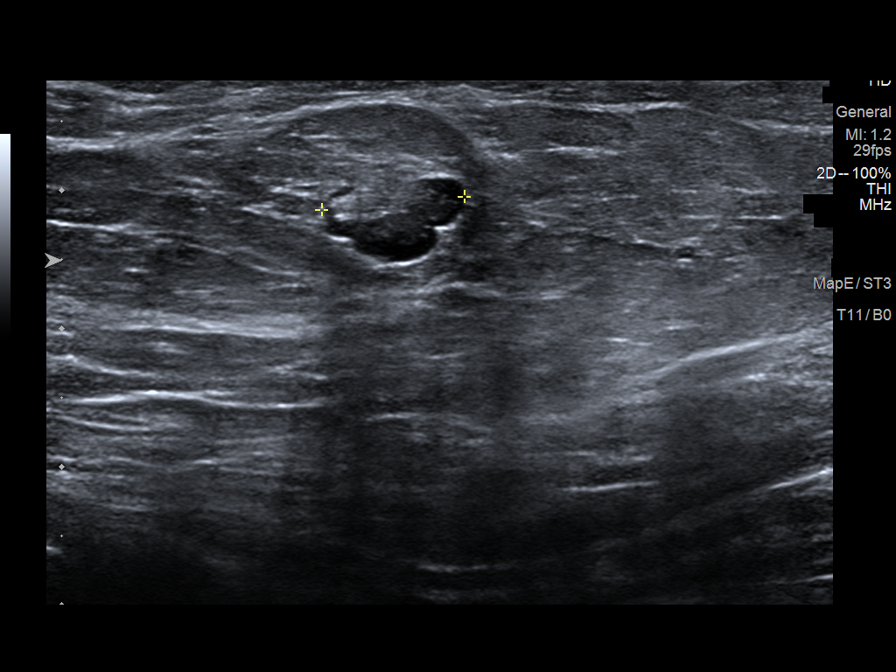
[im 5/9]
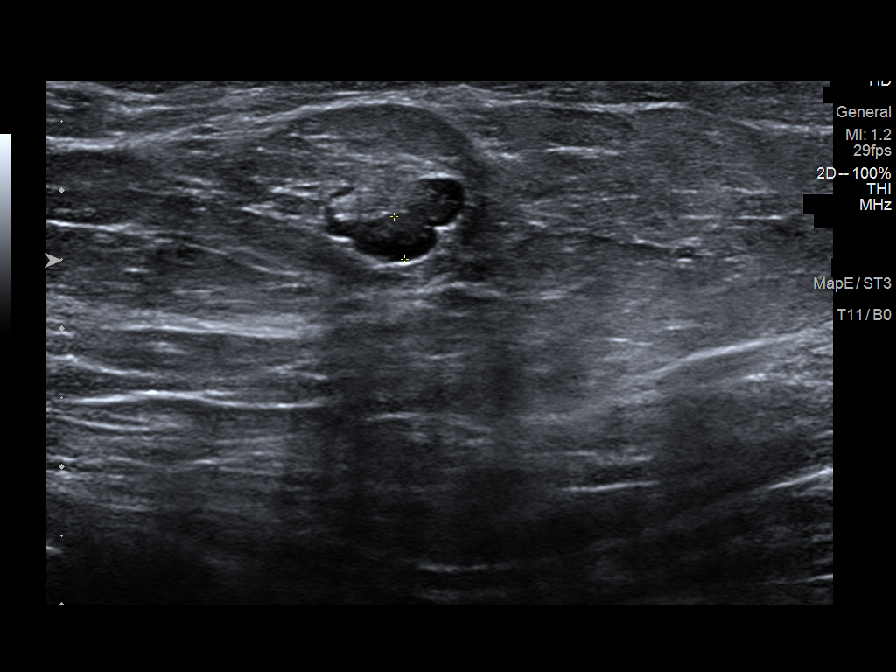
[im 6/9]
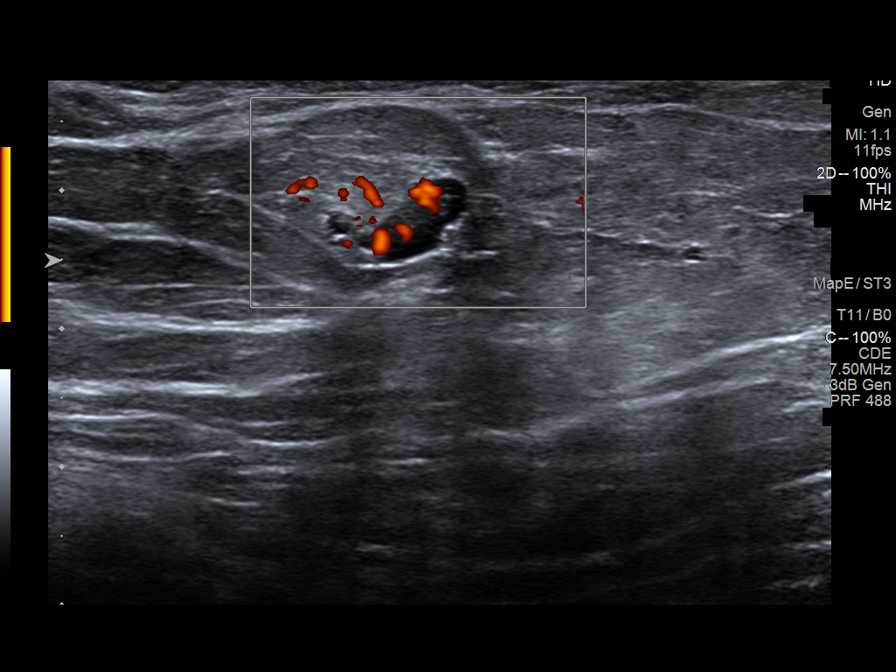
[im 7/9]
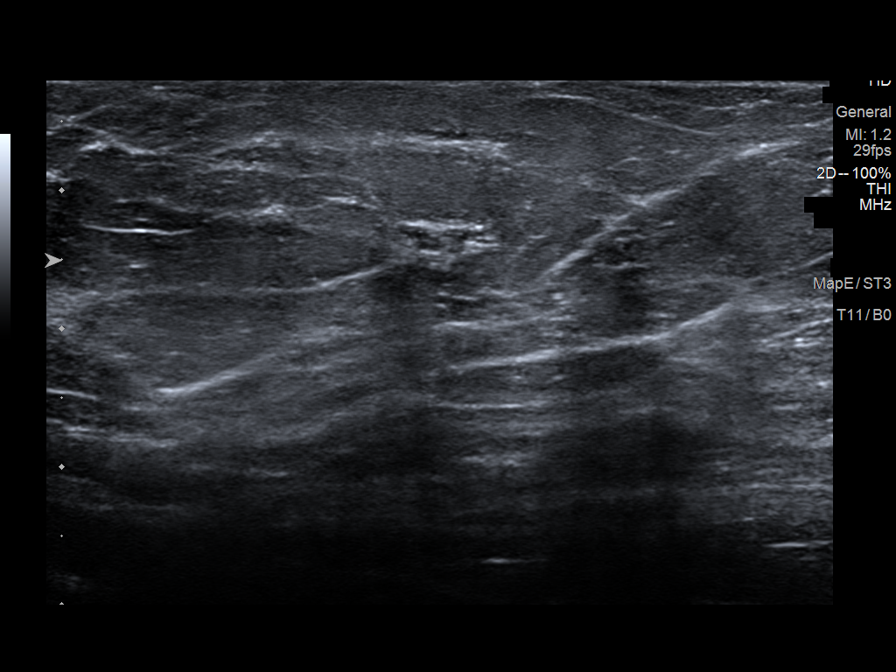
[im 8/9]
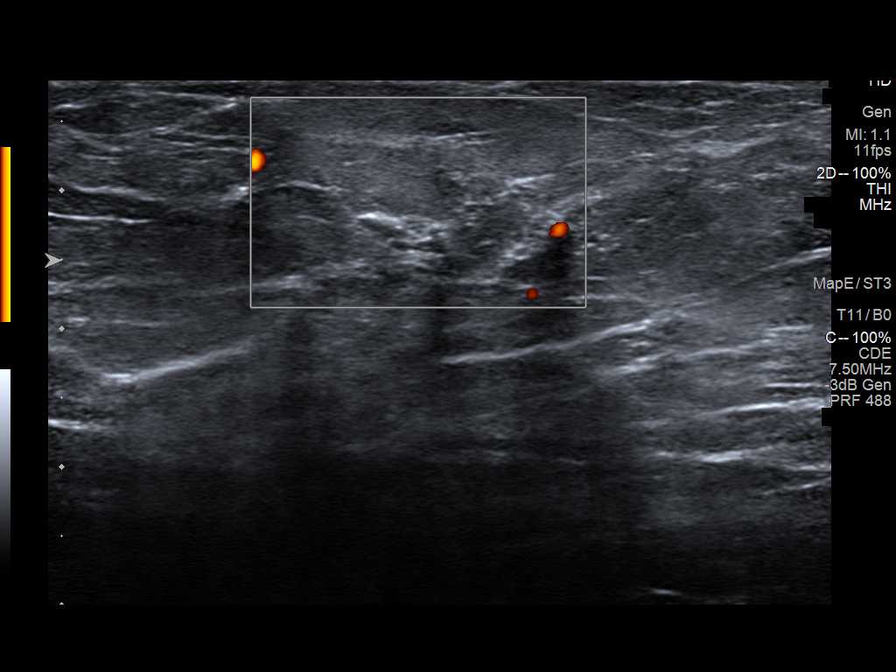
[im 9/9]
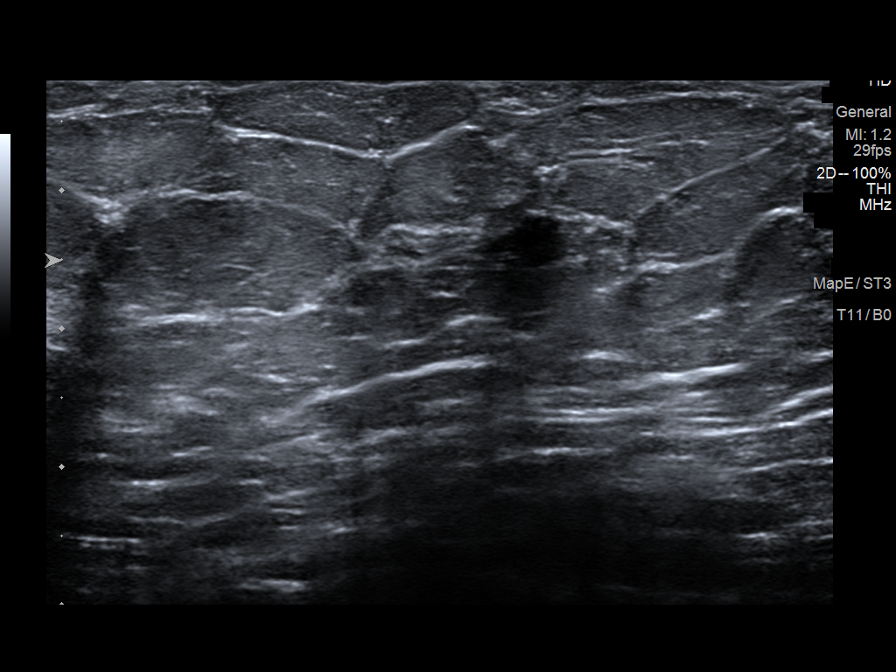

[9 of 9 positions shown; findings below may reference images not displayed]

ACR Breast Density Category b: There are scattered areas of
fibroglandular density.
FINDINGS: An oval circumscribed mass is confirmed within the upper-outer
quadrant of the RIGHT breast, at middle depth, measuring
approximately 1 cm greatest dimension, possible benign intramammary
lymph node.

There is an additional persistent asymmetry within the outer RIGHT
breast, middle to posterior depth, measuring approximately 6 mm
greatest dimension, small paddle spot compression CC slice 46, MLO
slice 42.

Mammographic images were processed with CAD.

Targeted ultrasound is performed, showing a benign morphologically
normal intramammary lymph node within the RIGHT breast at the 9:30
o'clock axis, 12 cm from the nipple, measuring 1 cm in length,
corresponding to 1 of the mammographic findings.

There is an additional subtle hypoechoic area in the RIGHT breast at
the 9 o'clock axis, 12 cm from the nipple, measuring 7 mm greatest
dimension, most likely a benign cluster of microcysts, without
internal vascularity, possible correlate for the additional
mammographic asymmetry.
IMPRESSION: 1. Probably benign asymmetry within the outer RIGHT breast, at
middle to posterior depth, measuring approximately 6 mm greatest
dimension, with ultrasound showing a possible correlate of a
probably benign cluster of microcysts at the 9 o'clock axis, 12 cm
from the nipple, measuring 7 mm. Recommend follow-up RIGHT breast
diagnostic mammogram and ultrasound in 6 months to ensure stability.
2. Benign intramammary lymph node within the RIGHT breast at the
9:30 o'clock axis, measuring 1 cm, corresponding to the other
mammographic finding. No follow-up is necessary for this benign
finding.

RECOMMENDATION:
RIGHT breast diagnostic mammogram and ultrasound in 6 months.

I have discussed the findings and recommendations with the patient.
If applicable, a reminder letter will be sent to the patient
regarding the next appointment.

BI-RADS CATEGORY  3: Probably benign.

## 2021-09-19 DIAGNOSIS — Z23 Encounter for immunization: Secondary | ICD-10-CM | POA: Diagnosis not present

## 2021-09-19 DIAGNOSIS — E1165 Type 2 diabetes mellitus with hyperglycemia: Secondary | ICD-10-CM | POA: Diagnosis not present

## 2021-09-19 DIAGNOSIS — I10 Essential (primary) hypertension: Secondary | ICD-10-CM | POA: Diagnosis not present

## 2021-09-19 DIAGNOSIS — R252 Cramp and spasm: Secondary | ICD-10-CM | POA: Diagnosis not present

## 2021-09-19 DIAGNOSIS — Z5181 Encounter for therapeutic drug level monitoring: Secondary | ICD-10-CM | POA: Diagnosis not present

## 2022-01-01 ENCOUNTER — Encounter (HOSPITAL_COMMUNITY): Payer: Self-pay

## 2022-01-01 ENCOUNTER — Emergency Department (HOSPITAL_COMMUNITY): Payer: BC Managed Care – PPO

## 2022-01-01 ENCOUNTER — Emergency Department (HOSPITAL_COMMUNITY)
Admission: EM | Admit: 2022-01-01 | Discharge: 2022-01-01 | Disposition: A | Discharge status: Home / Self Care | Payer: BC Managed Care – PPO | Attending: Emergency Medicine | Admitting: Emergency Medicine

## 2022-01-01 ENCOUNTER — Other Ambulatory Visit: Payer: Self-pay

## 2022-01-01 DIAGNOSIS — S8391XA Sprain of unspecified site of right knee, initial encounter: Secondary | ICD-10-CM | POA: Diagnosis not present

## 2022-01-01 DIAGNOSIS — M25561 Pain in right knee: Secondary | ICD-10-CM | POA: Diagnosis not present

## 2022-01-01 DIAGNOSIS — M2550 Pain in unspecified joint: Secondary | ICD-10-CM | POA: Diagnosis not present

## 2022-01-01 NOTE — Discharge Instructions (Signed)
You have been seen today for your complaint of right knee pain. Your imaging was negative. Your discharge medications include Alternate tylenol and ibuprofen for pain. You may alternate these every 4 hours. You may take up to 800 mg of ibuprofen at a time and up to 1000 mg of tylenol. Home care instructions are as follows:  Rest.  Ice the affected extremity 15 minutes at a time multiple times throughout the day.  Compress to the affected knee with an Ace wrap when resting.  Elevate the leg above the level of the heart when resting Follow up with: Dr. Doran Durand.  He is an Doctor, general practice.  You should call tomorrow to schedule an appointment for an ED follow-up visit Please seek immediate medical care if you develop any of the following symptoms: You cannot use your injured knee to support any of your body weight (cannot bear weight). You cannot move the injured joint. You cannot walk more than a few steps without pain or without your knee buckling. You have a lot of pain, swelling, or numbness in the leg below the cast, brace, or splint. Your foot or toes are numb, cold, or blue after you loosen your splint or brace. At this time there does not appear to be the presence of an emergent medical condition, however there is always the potential for conditions to change. Please read and follow the below instructions.  Do not take your medicine if  develop an itchy rash, swelling in your mouth or lips, or difficulty breathing; call 911 and seek immediate emergency medical attention if this occurs.  You may review your lab tests and imaging results in their entirety on your MyChart account.  Please discuss all results of fully with your primary care provider and other specialist at your follow-up visit.  Note: Portions of this text may have been transcribed using voice recognition software. Every effort was made to ensure accuracy; however, inadvertent computerized transcription errors may still be  present.

## 2022-01-01 NOTE — ED Triage Notes (Signed)
Patient states she has had right knee pain since 11/08/21. Patient was using OTC pain relief which worked.  Today, the patient was walking up a hill and felt and heard a pop in the right knee. Patient is now c/o increased right knee pain and is unable to bear weight.

## 2022-01-01 NOTE — ED Provider Notes (Signed)
[facility] DEPT Provider Note   CSN: [id number] Arrival date & time: 01/01/22  1307     History  Chief Complaint  Patient presents with   Knee Pain    Denise Riggs is a 56 y.o. female.  Who presents ED for evaluation of atraumatic right knee pain.  She states she was walking up a hill at her father's house when she felt her knee pop.  Her knee did give out, but she was able to catch herself on the vehicle she was standing next to.  She does not have a past but has pain rated at a 9 out of 10 with ambulation.  States she has had right knee pain for the past month but has been relieved with ibuprofen at home.  Denies unilateral swelling, history of DVTs, recent surgery, cancer treatment or travel, numbness, tingling, pain to the calf for posterior of the leg.   Knee Pain      Home Medications Prior to Admission medications   Medication Sig Start Date End Date Taking? Authorizing Provider  atorvastatin (LIPITOR) 10 MG tablet Take 10 mg by mouth daily. 01/18/20   [provider]  loratadine-pseudoephedrine (CLARITIN-D 24-HOUR) 10-240 MG 24 hr tablet Take 1 tablet by mouth daily as needed for allergies.    [provider]  metFORMIN (GLUCOPHAGE) 500 MG tablet Take 500 mg by mouth 2 (two) times daily with a meal.  04/22/19   [provider]      Allergies    Patient has no known allergies.    Review of Systems   Review of Systems  Musculoskeletal:  Positive for arthralgias.  All other systems reviewed and are negative.   Physical Exam Updated Vital Signs BP 134/74 (BP Location: Left Arm)   Pulse 84   Temp 98.4 F (36.9 C) (Oral)   Resp 18   Ht '5\' 7"'$  (1.702 m)   Wt 103 kg   SpO2 99%   BMI 35.55 kg/m  Physical Exam Vitals and nursing note reviewed.  Constitutional:      General: She is not in acute distress.    Appearance: Normal appearance. She is normal weight. She is not ill-appearing.  HENT:     Head:  Normocephalic and atraumatic.  Pulmonary:     Effort: Pulmonary effort is normal. No respiratory distress.  Abdominal:     General: Abdomen is flat.  Musculoskeletal:        General: Tenderness (Anterior right knee) present. No swelling or deformity. Normal range of motion.     Cervical back: Neck supple.     Comments: No joint space laxity of the right knee.  No unilateral swelling.  Sensation intact.  Normal pulses.  Skin:    General: Skin is warm and dry.  Neurological:     Mental Status: She is alert and oriented to person, place, and time.  Psychiatric:        Mood and Affect: Mood normal.        Behavior: Behavior normal.     ED Results / Procedures / Treatments   Labs (all labs ordered are listed, but only abnormal results are displayed) Labs Reviewed - No data to display  EKG None  Radiology DG Knee Complete 4 Views Right  Result Date: 01/01/2022 CLINICAL DATA:  Pain right knee EXAM: RIGHT KNEE - COMPLETE 4+ VIEW COMPARISON:  None Available. FINDINGS: No fracture or dislocation is seen. There is soft tissue fullness in suprapatellar bursa suggesting small  effusion. Minimal bony spurs are seen in medial and lateral compartments. IMPRESSION: No recent fracture or dislocation is seen. Minimal bony spurs are seen. Possible small effusion is present in suprapatellar bursa. Electronically Signed   By: Elmer Picker M.D.   On: 01/01/2022 14:38    Procedures Procedures    Medications Ordered in ED Medications - No data to display  ED Course/ Medical Decision Making/ A&P                           Medical Decision Making Amount and/or Complexity of Data Reviewed Radiology: ordered.  This patient presents to the ED for concern of right knee pain, this involves an extensive number of treatment options, and is a complaint that carries with it a high risk of complications and morbidity.  The differential diagnosis includes fracture, contusion, dislocation, tendon or  ligament injury  My initial workup includes x-ray right knee  Additional history obtained from: Nursing notes from this visit.  I ordered imaging studies including x-ray right knee I independently visualized and interpreted imaging which showed normal I agree with the radiologist interpretation  Afebrile, hemodynamically stable.  56 year old female presents ED for evaluation of atraumatic right knee pain.  X-ray negative.  Physical exam is remarkable for tenderness to palpation of the anterior of the right knee, is otherwise unremarkable.  There are no clinical signs or symptoms of DVT and low suspicion for this as a cause of her symptoms.  Patient has a sprain of the right knee.  She may have some ligamentous injury as well.  She was given contact information for orthopedic follow-up.  He was given information regarding appropriate dosing of ibuprofen and Tylenol.  Was given information regarding RICE therapy.  She was given return precautions.  Stable at discharge.  At this time there does not appear to be any evidence of an acute emergency medical condition and the patient appears stable for discharge with appropriate outpatient follow up. Diagnosis was discussed with patient who verbalizes understanding of care plan and is agreeable to discharge. I have discussed return precautions with patient and husband who verbalizes understanding. Patient encouraged to follow-up with their PCP within 1 week. All questions answered.  Note: Portions of this report may have been transcribed using voice recognition software. Every effort was made to ensure accuracy; however, inadvertent computerized transcription errors may still be present.         Final Clinical Impression(s) / ED Diagnoses Final diagnoses:  None    Rx / DC Orders ED Discharge Orders     None         Nehemiah Massed 01/01/22 1509    Davonna Belling, MD 01/02/22 [phone]

## 2022-01-03 DIAGNOSIS — M25561 Pain in right knee: Secondary | ICD-10-CM | POA: Diagnosis not present

## 2022-01-11 DIAGNOSIS — M25561 Pain in right knee: Secondary | ICD-10-CM | POA: Diagnosis not present

## 2022-01-15 DIAGNOSIS — M25561 Pain in right knee: Secondary | ICD-10-CM | POA: Diagnosis not present

## 2022-01-18 DIAGNOSIS — Z0000 Encounter for general adult medical examination without abnormal findings: Secondary | ICD-10-CM | POA: Diagnosis not present

## 2022-01-18 DIAGNOSIS — E1165 Type 2 diabetes mellitus with hyperglycemia: Secondary | ICD-10-CM | POA: Diagnosis not present

## 2022-01-18 DIAGNOSIS — I10 Essential (primary) hypertension: Secondary | ICD-10-CM | POA: Diagnosis not present

## 2022-01-18 DIAGNOSIS — Z13220 Encounter for screening for lipoid disorders: Secondary | ICD-10-CM | POA: Diagnosis not present

## 2022-01-25 DIAGNOSIS — S83271A Complex tear of lateral meniscus, current injury, right knee, initial encounter: Secondary | ICD-10-CM | POA: Diagnosis not present

## 2022-01-25 DIAGNOSIS — Y999 Unspecified external cause status: Secondary | ICD-10-CM | POA: Diagnosis not present

## 2022-01-25 DIAGNOSIS — S83281A Other tear of lateral meniscus, current injury, right knee, initial encounter: Secondary | ICD-10-CM | POA: Diagnosis not present

## 2022-01-25 DIAGNOSIS — M94261 Chondromalacia, right knee: Secondary | ICD-10-CM | POA: Diagnosis not present

## 2022-01-25 DIAGNOSIS — G8918 Other acute postprocedural pain: Secondary | ICD-10-CM | POA: Diagnosis not present

## 2022-01-25 DIAGNOSIS — M6751 Plica syndrome, right knee: Secondary | ICD-10-CM | POA: Diagnosis not present

## 2022-01-25 DIAGNOSIS — X58XXXA Exposure to other specified factors, initial encounter: Secondary | ICD-10-CM | POA: Diagnosis not present

## 2022-02-07 DIAGNOSIS — S83261D Peripheral tear of lateral meniscus, current injury, right knee, subsequent encounter: Secondary | ICD-10-CM | POA: Diagnosis not present

## 2022-02-21 DIAGNOSIS — S83261D Peripheral tear of lateral meniscus, current injury, right knee, subsequent encounter: Secondary | ICD-10-CM | POA: Diagnosis not present

## 2022-03-06 DIAGNOSIS — E119 Type 2 diabetes mellitus without complications: Secondary | ICD-10-CM | POA: Diagnosis not present

## 2022-03-06 DIAGNOSIS — H18593 Other hereditary corneal dystrophies, bilateral: Secondary | ICD-10-CM | POA: Diagnosis not present

## 2022-03-06 DIAGNOSIS — H5203 Hypermetropia, bilateral: Secondary | ICD-10-CM | POA: Diagnosis not present

## 2022-03-06 DIAGNOSIS — H524 Presbyopia: Secondary | ICD-10-CM | POA: Diagnosis not present

## 2022-03-06 DIAGNOSIS — H2513 Age-related nuclear cataract, bilateral: Secondary | ICD-10-CM | POA: Diagnosis not present

## 2022-04-17 DIAGNOSIS — Z6836 Body mass index (BMI) 36.0-36.9, adult: Secondary | ICD-10-CM | POA: Diagnosis not present

## 2022-04-17 DIAGNOSIS — Z01419 Encounter for gynecological examination (general) (routine) without abnormal findings: Secondary | ICD-10-CM | POA: Diagnosis not present

## 2022-04-17 DIAGNOSIS — Z780 Asymptomatic menopausal state: Secondary | ICD-10-CM | POA: Diagnosis not present

## 2022-04-17 DIAGNOSIS — Z124 Encounter for screening for malignant neoplasm of cervix: Secondary | ICD-10-CM | POA: Diagnosis not present

## 2022-04-17 DIAGNOSIS — Z1151 Encounter for screening for human papillomavirus (HPV): Secondary | ICD-10-CM | POA: Diagnosis not present

## 2022-05-01 DIAGNOSIS — Z1231 Encounter for screening mammogram for malignant neoplasm of breast: Secondary | ICD-10-CM | POA: Diagnosis not present

## 2022-05-08 DIAGNOSIS — N939 Abnormal uterine and vaginal bleeding, unspecified: Secondary | ICD-10-CM | POA: Diagnosis not present

## 2022-05-21 DIAGNOSIS — E669 Obesity, unspecified: Secondary | ICD-10-CM | POA: Diagnosis not present

## 2022-05-21 DIAGNOSIS — E1165 Type 2 diabetes mellitus with hyperglycemia: Secondary | ICD-10-CM | POA: Diagnosis not present

## 2022-05-21 DIAGNOSIS — I10 Essential (primary) hypertension: Secondary | ICD-10-CM | POA: Diagnosis not present

## 2022-07-22 DIAGNOSIS — H6691 Otitis media, unspecified, right ear: Secondary | ICD-10-CM | POA: Diagnosis not present

## 2022-07-22 DIAGNOSIS — J208 Acute bronchitis due to other specified organisms: Secondary | ICD-10-CM | POA: Diagnosis not present

## 2022-07-30 ENCOUNTER — Ambulatory Visit
Admission: RE | Admit: 2022-07-30 | Discharge: 2022-07-30 | Disposition: A | Discharge status: Home / Self Care | Payer: BC Managed Care – PPO | Source: Ambulatory Visit | Attending: Physician Assistant | Admitting: Physician Assistant

## 2022-07-30 ENCOUNTER — Other Ambulatory Visit: Payer: Self-pay | Admitting: Physician Assistant

## 2022-07-30 DIAGNOSIS — J069 Acute upper respiratory infection, unspecified: Secondary | ICD-10-CM

## 2022-08-06 DIAGNOSIS — J069 Acute upper respiratory infection, unspecified: Secondary | ICD-10-CM | POA: Diagnosis not present

## 2022-08-16 DIAGNOSIS — R102 Pelvic and perineal pain: Secondary | ICD-10-CM | POA: Diagnosis not present

## 2022-08-20 DIAGNOSIS — H938X1 Other specified disorders of right ear: Secondary | ICD-10-CM | POA: Diagnosis not present

## 2022-09-05 DIAGNOSIS — N939 Abnormal uterine and vaginal bleeding, unspecified: Secondary | ICD-10-CM | POA: Diagnosis not present

## 2022-09-05 DIAGNOSIS — M545 Low back pain, unspecified: Secondary | ICD-10-CM | POA: Diagnosis not present

## 2022-09-05 DIAGNOSIS — R102 Pelvic and perineal pain: Secondary | ICD-10-CM | POA: Diagnosis not present

## 2022-09-17 DIAGNOSIS — I10 Essential (primary) hypertension: Secondary | ICD-10-CM | POA: Diagnosis not present

## 2022-09-17 DIAGNOSIS — E1165 Type 2 diabetes mellitus with hyperglycemia: Secondary | ICD-10-CM | POA: Diagnosis not present

## 2022-09-17 DIAGNOSIS — H65191 Other acute nonsuppurative otitis media, right ear: Secondary | ICD-10-CM | POA: Diagnosis not present

## 2022-09-17 DIAGNOSIS — E669 Obesity, unspecified: Secondary | ICD-10-CM | POA: Diagnosis not present

## 2022-10-01 ENCOUNTER — Encounter (INDEPENDENT_AMBULATORY_CARE_PROVIDER_SITE_OTHER): Payer: Self-pay | Admitting: Otolaryngology

## 2022-10-01 ENCOUNTER — Ambulatory Visit (INDEPENDENT_AMBULATORY_CARE_PROVIDER_SITE_OTHER): Payer: BC Managed Care – PPO | Admitting: Otolaryngology

## 2022-10-01 VITALS — BP 157/90 | HR 82 | Ht 67.0 in | Wt 236.0 lb

## 2022-10-01 DIAGNOSIS — H6991 Unspecified Eustachian tube disorder, right ear: Secondary | ICD-10-CM | POA: Diagnosis not present

## 2022-10-01 DIAGNOSIS — J069 Acute upper respiratory infection, unspecified: Secondary | ICD-10-CM | POA: Diagnosis not present

## 2022-10-01 DIAGNOSIS — H938X1 Other specified disorders of right ear: Secondary | ICD-10-CM

## 2022-10-01 NOTE — Progress Notes (Signed)
Otolaryngology Clinic Note Referring physician: Dr. Tomasa Rand HPI:  Denise Riggs is a 57 y.o. female kindly referred by Dr. Tomasa Rand for evaluation of right ear fullness. Patient reports that she had a viral URI symptoms in July 2024, and recovered from these symptoms but started to have right ear fullness and "feeling under water" and some tinnitus (non-pulsatile). She denied drainage, vertigo, pain. She tried wax removal drops, but did not help. She did get amoxicillin, but no steroids. PCP diagnosed her with a ear infection. She reports that her hearing is much better, but still feels like there is fluid in there. Denies hearing change now  She is currently taking claritin for allergies. Does not use flonase consistently.   She otherwise denies any other eustachian tube symptoms like popping of the ears, deep pain in the ear, changes in symptoms with pressure.  No problems with ears in the past, denies ear surgery in the past. Does not take vestibular suppressants, no barotrauma  PMHx: DM (non-insulin dependent)   PMH/Meds/All/SocHx/FamHx/ROS:   Past Medical History:  Diagnosis Date   Diabetes mellitus without complication (HCC)    Denies heart/lung/kidney issues, diabetes, previous diagnosis of cance  Past Surgical History:  Procedure Laterality Date   CESAREAN SECTION     x2   COSMETIC SURGERY     FLEXIBLE SIGMOIDOSCOPY N/A 10/13/2019   Procedure: FLEXIBLE SIGMOIDOSCOPY;  Surgeon: Charlott Rakes, MD;  Location: [facility];  Service: Endoscopy;  Laterality: N/A;   Denies history of head or neck surgery  Family History  Problem Relation Age of Onset   Diabetes Mother    Heart disease Mother    Hypertension Mother    Stroke Mother    Diabetes Father    No family history of bleeding disorders or difficulty with anesthesia  Social Connections: Not on file    Tobacco: never smoked. Lives in husband and kids.    Current Outpatient Medications:    atorvastatin  (LIPITOR) 10 MG tablet, Take 10 mg by mouth daily., Disp: , Rfl:    loratadine-pseudoephedrine (CLARITIN-D 24-HOUR) 10-240 MG 24 hr tablet, Take 1 tablet by mouth daily as needed for allergies., Disp: , Rfl:    metFORMIN (GLUCOPHAGE) 500 MG tablet, Take 500 mg by mouth 2 (two) times daily with a meal. , Disp: , Rfl:   A 20-point ROS was performed with pertinent positives/negatives noted in the HPI.   Physical Exam:   BP (!) 157/90 (BP Location: Right Arm, Patient Position: Sitting, Cuff Size: Normal)   Pulse 82   Ht 5\' 7"  (1.702 m)   Wt 236 lb (107 kg)   SpO2 98%   BMI 36.96 kg/m    Salient findings:  CN II-XII intact  Bilateral EAC clear and TM intact with well pneumatized middle ear spaces; small hairs (in EAC) noted - query dog? given the patient's complaints, ear microscopic exam was performed Weber 512: middle Rinne 512: AC > BC b/l  Rine 1024: AC > BC b/l  Anterior rhinoscopy: Septum deviated right; mucosa dry wit hsome anterior left septum scabs; bilateral inferior turbinates with minimal hypertrophy No lesions of oral cavity/oropharynx; dentition  No obviously palpable neck masses/lymphadenopathy/thyromegaly No respiratory distress or stridor  Independent Review of Additional Tests or Records:  PA notes reviewed indepepdently  Procedures:  Procedure: Bilateral ear microscopy using microscope (CPT 92504) Pre-procedure diagnosis: right ear fullness, bilateral small hairs Post-procedure diagnosis: same Indication: ear fullness, bilateral small hairs; given patient's otologic complaints and history, for improved and comprehensive examination of  external ear and tympanic membrane, bilateral otologic examination using microscope was performed  Procedure: Patient was placed semi-recumbent. Both ear canals were examined using the microscope with findings above. Small hairs were removed from each ear canal bilaterally Left: EAC was patent. TM was intact. Middle ear was aerated.  Drainage: none Right: EAC was patent. TM was intact. Middle ear was aerated. Drainage: none Patient tolerated the procedure well.  Pneumatic otoscopy with mobile TM bilaterally  Impression & Plans:  Denise Riggs is a 57 y.o. female with right ear fullness after URI symptoms. No symptoms on left. Exam today showed bilateral aerated middle ear spaces and her improvement (fullness primarily) is significantly improved. She reports hearing is grossly intact.   Right ear fullness - See above; Ddx primarily includes right ear effusion from URI and subsequent eustachian tube dysfunction. No effusion noted today. - Options discussed including observation, further workup with audio, short steroid course, nasal steroids, and myringotomy - She opted for nasal steroids and observation especially given allergy symptoms as well - Restart flonase daily 2 puffs - Continue home antihistamine - We discussed f/u - given lack of effusion, will do follow up PRN  MDM:  Level 3: 99203 Complexity/Problems addressed: low Data complexity: low - Morbidity: low  - Prescription Drug prescribed or managed: yes    Thank you for allowing me the opportunity to care for your patient. Please do not hesitate to contact me should you have any other questions.  Sincerely, Jovita Kussmaul, MD Otolarynoglogist (ENT), [facility] Phone: [phone] Fax: [phone]  10/01/2022, 8:35 AM

## 2022-10-04 DIAGNOSIS — J038 Acute tonsillitis due to other specified organisms: Secondary | ICD-10-CM | POA: Diagnosis not present

## 2022-12-14 DIAGNOSIS — R509 Fever, unspecified: Secondary | ICD-10-CM | POA: Diagnosis not present

## 2022-12-14 DIAGNOSIS — Z03818 Encounter for observation for suspected exposure to other biological agents ruled out: Secondary | ICD-10-CM | POA: Diagnosis not present
